# Patient Record
Sex: Female | Born: 1990 | Hispanic: Yes | Marital: Married
Health system: Southern US, Academic
[De-identification: ages and names within clinical notes are randomized; demographics above are authoritative.]

## PROBLEM LIST (undated history)

## (undated) ENCOUNTER — Encounter

## (undated) ENCOUNTER — Ambulatory Visit

## (undated) ENCOUNTER — Telehealth

## (undated) ENCOUNTER — Ambulatory Visit: Attending: Registered" | Primary: Registered"

## (undated) ENCOUNTER — Encounter: Attending: Specialist | Primary: Specialist

## (undated) ENCOUNTER — Encounter: Attending: Obstetrics & Gynecology | Primary: Obstetrics & Gynecology

## (undated) ENCOUNTER — Encounter: Attending: Medical | Primary: Medical

## (undated) ENCOUNTER — Ambulatory Visit: Attending: Specialist | Primary: Specialist

## (undated) ENCOUNTER — Telehealth: Attending: Registered" | Primary: Registered"

## (undated) ENCOUNTER — Telehealth: Attending: Specialist | Primary: Specialist

## (undated) ENCOUNTER — Encounter: Attending: Registered" | Primary: Registered"

## (undated) ENCOUNTER — Encounter
Attending: Pharmacist Clinician (PhC)/ Clinical Pharmacy Specialist | Primary: Pharmacist Clinician (PhC)/ Clinical Pharmacy Specialist

## (undated) ENCOUNTER — Encounter: Attending: Psychiatry | Primary: Psychiatry

## (undated) ENCOUNTER — Ambulatory Visit: Attending: Infectious Disease | Primary: Infectious Disease

## (undated) ENCOUNTER — Telehealth: Attending: Obstetrics & Gynecology | Primary: Obstetrics & Gynecology

## (undated) ENCOUNTER — Telehealth
Attending: Student in an Organized Health Care Education/Training Program | Primary: Student in an Organized Health Care Education/Training Program

## (undated) DIAGNOSIS — N6452 Nipple discharge: Secondary | ICD-10-CM

## (undated) DIAGNOSIS — B20 Human immunodeficiency virus [HIV] disease: Secondary | ICD-10-CM

---

## 2015-09-29 ENCOUNTER — Encounter (HOSPITAL_COMMUNITY): Payer: Self-pay | Admitting: Emergency Medicine

## 2015-09-29 ENCOUNTER — Inpatient Hospital Stay (HOSPITAL_COMMUNITY)
Admission: EM | Admit: 2015-09-29 | Discharge: 2015-10-03 | DRG: 603 | Disposition: A | Discharge status: Home / Self Care | Payer: Self-pay | Attending: General Surgery | Admitting: General Surgery

## 2015-09-29 ENCOUNTER — Emergency Department (HOSPITAL_COMMUNITY): Payer: Self-pay

## 2015-09-29 DIAGNOSIS — R739 Hyperglycemia, unspecified: Secondary | ICD-10-CM | POA: Diagnosis present

## 2015-09-29 DIAGNOSIS — E669 Obesity, unspecified: Secondary | ICD-10-CM | POA: Diagnosis present

## 2015-09-29 DIAGNOSIS — L02211 Cutaneous abscess of abdominal wall: Principal | ICD-10-CM | POA: Diagnosis present

## 2015-09-29 DIAGNOSIS — L0291 Cutaneous abscess, unspecified: Secondary | ICD-10-CM

## 2015-09-29 DIAGNOSIS — L0390 Cellulitis, unspecified: Secondary | ICD-10-CM

## 2015-09-29 LAB — BASIC METABOLIC PANEL
Anion gap: 9 (ref 5–15)
BUN: 7 mg/dL (ref 6–20)
CALCIUM: 8.6 mg/dL — AB (ref 8.9–10.3)
CO2: 30 mmol/L (ref 22–32)
CREATININE: 0.79 mg/dL (ref 0.44–1.00)
Chloride: 98 mmol/L — ABNORMAL LOW (ref 101–111)
GFR calc Af Amer: >60 mL/min (ref >60)
GLUCOSE: 144 mg/dL — AB (ref 65–99)
POTASSIUM: 3.4 mmol/L — AB (ref 3.5–5.1)
Sodium: 137 mmol/L (ref 135–145)

## 2015-09-29 LAB — CBC WITH DIFFERENTIAL/PLATELET
Basophils Absolute: 0 10*3/uL (ref 0.0–0.1)
Basophils Relative: 0 %
EOS PCT: 1 %
Eosinophils Absolute: 0.1 10*3/uL (ref 0.0–0.7)
HCT: 36.8 % (ref 36.0–46.0)
Hemoglobin: 11.8 g/dL — ABNORMAL LOW (ref 12.0–15.0)
LYMPHS ABS: 2.5 10*3/uL (ref 0.7–4.0)
LYMPHS PCT: 32 %
MCH: 25.1 pg — AB (ref 26.0–34.0)
MCHC: 32.1 g/dL (ref 30.0–36.0)
MCV: 78.3 fL (ref 78.0–100.0)
MONO ABS: 0.5 10*3/uL (ref 0.1–1.0)
MONOS PCT: 7 %
Neutro Abs: 4.7 10*3/uL (ref 1.7–7.7)
Neutrophils Relative %: 60 %
PLATELETS: 282 10*3/uL (ref 150–400)
RBC: 4.7 MIL/uL (ref 3.87–5.11)
RDW: 13.6 % (ref 11.5–15.5)
WBC: 7.8 10*3/uL (ref 4.0–10.5)

## 2015-09-29 MED ORDER — SODIUM CHLORIDE 0.9 % IV BOLUS (SEPSIS)
1000.0000 mL | Freq: Once | INTRAVENOUS | Status: AC
  Administered 2015-09-29: 1000 mL via INTRAVENOUS

## 2015-09-29 NOTE — ED Provider Notes (Signed)
CSN: [id number]     Arrival date & time 09/29/15  2041 History   First MD Initiated Contact with Patient 09/29/15 2238     Chief Complaint  Patient presents with  . Abscess  . Cellulitis   Jamie Heath is a 24 y.o. female with a history of a previous cholecystectomy many years ago presents to the emergency department complaining of a draining abscess to her right lateral abdomen and back. She reports she first noticed this area approximately 1 week ago and has been getting progressively worse. She reports initially one week ago she had flulike symptoms that have since resolved. She reports currently she is having yellowish and whitish discharge from it. She complains of 5 out of 10 pain there. She put she had some fevers last week but no fevers recently. She denies history of similar abscesses. She denies recent surgeries. She denies fevers, chills, abdominal pain, nausea, vomiting, diarrhea, coughing, shortness of breath, chest pain, urinary symptoms, back pain, numbness, tingling, weakness, or new rashes. She denies trauma or history of shingles previously.  (Consider location/radiation/quality/duration/timing/severity/associated sxs/prior Treatment) Patient is a 24 y.o. female presenting with abscess. The history is provided by the patient. A language interpreter was used.  Abscess Associated symptoms: no fever, no headaches, no nausea and no vomiting     History reviewed. No pertinent past medical history. History reviewed. No pertinent past surgical history. No family history on file. Social History  Substance Use Topics  . Smoking status: Never Smoker   . Smokeless tobacco: None  . Alcohol Use: No   OB History    No data available     Review of Systems  Constitutional: Negative for fever and chills.  HENT: Negative for sore throat.   Eyes: Negative for visual disturbance.  Respiratory: Negative for cough and shortness of breath.   Cardiovascular: Negative for chest  pain.  Gastrointestinal: Negative for nausea, vomiting, abdominal pain and diarrhea.  Genitourinary: Negative for dysuria, hematuria and difficulty urinating.  Musculoskeletal: Negative for back pain and neck pain.  Skin: Positive for color change and rash.  Neurological: Negative for weakness, numbness and headaches.      Allergies  Review of patient's allergies indicates no known allergies.  Home Medications   Prior to Admission medications   Not on File   BP 106/51 mmHg  Pulse 66  Temp(Src) 97.6 F (36.4 C) (Oral)  Resp 20  Ht  (1.549 m)  Wt 225 lb (102.059 kg)  BMI 42.54 kg/m2  SpO2 98%  LMP 09/03/2015 (Approximate) Physical Exam  Constitutional: She appears well-developed and well-nourished. No distress.  Nontoxic appearing obese female.  HENT:  Head: Normocephalic and atraumatic.  Mouth/Throat: Oropharynx is clear and moist.  Eyes: Conjunctivae are normal. Pupils are equal, round, and reactive to light. Right eye exhibits no discharge. Left eye exhibits no discharge.  Neck: Neck supple.  Cardiovascular: Normal rate, regular rhythm, normal heart sounds and intact distal pulses.  Exam reveals no gallop and no friction rub.   No murmur heard. Pulmonary/Chest: Effort normal and breath sounds normal. No respiratory distress. She has no wheezes. She has no rales.  Abdominal: Soft. Bowel sounds are normal. She exhibits no distension. There is no rebound and no guarding.  There is a linear-like area of abscess, erythema, and induration that radiates from her right lateral chest wall into her right back. There is discharge noted. There is tender to palpation. She has no abdominal tenderness to palpation. The area is about  24 cm in length.   Musculoskeletal: She exhibits no edema.  Lymphadenopathy:    She has no cervical adenopathy.  Neurological: She is alert. Coordination normal.  Skin: Skin is warm and dry. No rash noted. She is not diaphoretic. No erythema. No  pallor.  Psychiatric: She has a normal mood and affect. Her behavior is normal.  Nursing note and vitals reviewed.   ED Course  Procedures (including critical care time) Labs Review Labs Reviewed  CBC WITH DIFFERENTIAL/PLATELET - Abnormal; Notable for the following:    Hemoglobin 11.8 (*)    MCH 25.1 (*)    All other components within normal limits  BASIC METABOLIC PANEL - Abnormal; Notable for the following:    Potassium 3.4 (*)    Chloride 98 (*)    Glucose, Bld 144 (*)    Calcium 8.6 (*)    All other components within normal limits  CULTURE, BLOOD (ROUTINE X 2)  CULTURE, BLOOD (ROUTINE X 2)  I-STAT BETA HCG BLOOD, ED ([facility], [facility], [facility] ONLY)    Imaging Review Ct Abdomen W Contrast  09/30/2015  CLINICAL DATA:  Abscess on abdominal wall, draining. Reportedly cellulitic change in a perfect line. EXAM: CT ABDOMEN WITH CONTRAST TECHNIQUE: Multidetector CT imaging of the abdomen was performed using the standard protocol following bolus administration of intravenous contrast. CONTRAST:  100mL OMNIPAQUE IOHEXOL 300 MG/ML  SOLN COMPARISON:  None. FINDINGS: Lower chest and abdominal wall: There is a fairly discrete band of subcutaneous fluid and fat reticulation in the right flank, respecting midline. Although there are areas of discrete fluid there is no rim enhancing fluid collection. Changes extend to the mildly thick and lower right latissimus dorsi and right abdominal wall. No deep source is identified; no primary spinal or intraperitoneal infection. Reportedly no history of trauma. Hepatobiliary: No focal liver abnormality.No evidence of biliary obstruction or stone. Pancreas: Unremarkable. Spleen: Unremarkable. Adrenals/Urinary Tract: Negative adrenals. No hydronephrosis or stone. Stomach/Bowel:  No obstruction. Vascular/Lymphatic: No acute vascular abnormality. No mass or adenopathy. Peritoneal: No ascites or pneumoperitoneum. Musculoskeletal: Negative IMPRESSION: Band of subcutaneous fluid and  inflammation in the right flank correlating with history. Unusual discrete linear morphology for cellulitis, given no trauma history question severe shingles. No drainable fluid collection. Electronically Signed   By: Marnee SpringJonathon  Watts M.D.   On: 09/30/2015 01:05   I have personally reviewed and evaluated these images and lab results as part of my medical decision-making.   EKG Interpretation None      Filed Vitals:   09/29/15 2051 09/29/15 2221 09/29/15 2356 09/30/15 0048  BP: 120/81 113/56 119/69 106/51  Pulse: 85 74 76 66  Temp: 98.1 F (36.7 C) 97.6 F (36.4 C)    TempSrc: Oral Oral    Resp: 16 16 18 20   Height: 5\' 1"  (1.549 m)     Weight: 225 lb (102.059 kg)     SpO2: 98% 96% 97% 98%     MDM   Meds given in ED:  Medications  sodium chloride 0.9 % bolus 1,000 mL (0 mLs Intravenous Stopped 09/30/15 0047)  iohexol (OMNIPAQUE) 300 MG/ML solution 100 mL (100 mLs Intravenous Contrast Given 09/30/15 0024)    New Prescriptions   No medications on file    Final diagnoses:  Abscess and cellulitis    This  is a 24 y.o. female with a history of a previous cholecystectomy many years ago presents to the emergency department complaining of a draining abscess to her right lateral abdomen and back. She reports  she first noticed this area approximately 1 week ago and has been getting progressively worse. She reports initially one week ago she had flulike symptoms that have since resolved. She reports currently she is having yellowish and whitish discharge from it. She complains of 5 out of 10 pain there. She put she had some fevers last week but no fevers recently. She denies having any pain prior to discovering this abscess. She denies any symptoms of shingles prior to this abscess. She denies trauma. On exam the patient is afebrile nontoxic appearing. Patient's abdomen is soft and nontender to palpation. Patient has a large linear approximate 24 centimeter in length area of cellulitis and  abscess. An area approximately 10 cm in length is draining pus. She has no midline back tenderness. Will obtain CT of her abdomen with IV contrast to evaluate further. Negative i-STAT hCG. CBC shows no leukocytosis. BMP is remarkable for mildly elevated glucose of 144. CT of her abdomen contrast indicated a band of subcutaneous fluid and inflammation in the right flank with a unusual discrete linear morphology.  I feel this patient would benefit from formal incision and drainage in the OR due to thickness in area of abscess. Will consult general surgery. I consulted with general surgeon Dr. Janee Morn who reports he will be down to evaluate the patient. At shift change patient is awaiting evaluation by general surgery. Recent care handed off to Dr. Patria Mane who will disposition the patient after evaluation by general surgery.  This patient was discussed with and evaluated by Dr. Patria Mane who agrees with assessment and plan.     Everlene Farrier, PA-C 09/30/15 0139  Azalia Bilis, MD 09/30/15 [phone]

## 2015-09-29 NOTE — ED Notes (Signed)
Pt. presents with skin abscess with redness/swelling and drainage at right lateral waist onset last week , denies fever or chills.

## 2015-09-30 ENCOUNTER — Inpatient Hospital Stay (HOSPITAL_COMMUNITY): Payer: MEDICAID | Admitting: Anesthesiology

## 2015-09-30 ENCOUNTER — Inpatient Hospital Stay (HOSPITAL_COMMUNITY): Payer: Self-pay | Admitting: Anesthesiology

## 2015-09-30 ENCOUNTER — Encounter (HOSPITAL_COMMUNITY): Admission: EM | Disposition: A | Discharge status: Home / Self Care | Payer: Self-pay | Source: Home / Self Care

## 2015-09-30 ENCOUNTER — Encounter (HOSPITAL_COMMUNITY): Payer: Self-pay | Admitting: Radiology

## 2015-09-30 DIAGNOSIS — L02211 Cutaneous abscess of abdominal wall: Secondary | ICD-10-CM | POA: Diagnosis present

## 2015-09-30 HISTORY — PX: IRRIGATION AND DEBRIDEMENT ABSCESS: SHX5252

## 2015-09-30 LAB — GLUCOSE, CAPILLARY
Glucose-Capillary: 118 mg/dL — ABNORMAL HIGH (ref 65–99)
Glucose-Capillary: 79 mg/dL (ref 65–99)
Glucose-Capillary: 79 mg/dL (ref 65–99)
Glucose-Capillary: 99 mg/dL (ref 65–99)

## 2015-09-30 LAB — SURGICAL PCR SCREEN
MRSA, PCR: NEGATIVE
Staphylococcus aureus: POSITIVE — AB

## 2015-09-30 LAB — I-STAT BETA HCG BLOOD, ED (MC, WL, AP ONLY): I-stat hCG, quantitative: <5 m[IU]/mL (ref <5)

## 2015-09-30 SURGERY — IRRIGATION AND DEBRIDEMENT ABSCESS
Anesthesia: General | Site: Flank | Laterality: Right

## 2015-09-30 MED ORDER — LIDOCAINE HCL (CARDIAC) 20 MG/ML IV SOLN
INTRAVENOUS | Status: AC
  Filled 2015-09-30: qty 5

## 2015-09-30 MED ORDER — ONDANSETRON 4 MG PO TBDP: 4.0000 mg | ORAL_TABLET | Freq: Four times a day (QID) | ORAL | Status: DC | PRN

## 2015-09-30 MED ORDER — PANTOPRAZOLE SODIUM 40 MG IV SOLR: 40.0000 mg | Freq: Every day | INTRAVENOUS | Status: DC

## 2015-09-30 MED ORDER — SUCCINYLCHOLINE CHLORIDE 20 MG/ML IJ SOLN
INTRAMUSCULAR | Status: DC | PRN
  Administered 2015-09-30: 120 mg via INTRAVENOUS

## 2015-09-30 MED ORDER — MORPHINE SULFATE (PF) 2 MG/ML IV SOLN
2.0000 mg | INTRAVENOUS | Status: DC | PRN
  Administered 2015-09-30: 4 mg via INTRAVENOUS
  Administered 2015-10-01 – 2015-10-02 (×5): 2 mg via INTRAVENOUS
  Administered 2015-10-02: 4 mg via INTRAVENOUS
  Filled 2015-09-30: qty 2
  Filled 2015-09-30 (×4): qty 1
  Filled 2015-09-30: qty 2
  Filled 2015-09-30: qty 1

## 2015-09-30 MED ORDER — INSULIN ASPART 100 UNIT/ML ~~LOC~~ SOLN
0.0000 [IU] | SUBCUTANEOUS | Status: DC
  Administered 2015-10-02: 2 [IU] via SUBCUTANEOUS

## 2015-09-30 MED ORDER — FENTANYL CITRATE (PF) 100 MCG/2ML IJ SOLN
INTRAMUSCULAR | Status: DC | PRN
  Administered 2015-09-30: 50 ug via INTRAVENOUS
  Administered 2015-09-30: 100 ug via INTRAVENOUS
  Administered 2015-09-30: 50 ug via INTRAVENOUS

## 2015-09-30 MED ORDER — PANTOPRAZOLE SODIUM 40 MG PO TBEC
40.0000 mg | DELAYED_RELEASE_TABLET | Freq: Every day | ORAL | Status: DC
  Administered 2015-09-30 – 2015-10-03 (×4): 40 mg via ORAL
  Filled 2015-09-30 (×4): qty 1

## 2015-09-30 MED ORDER — OXYCODONE HCL 5 MG PO TABS: 5.0000 mg | ORAL_TABLET | ORAL | Status: DC | PRN

## 2015-09-30 MED ORDER — LACTATED RINGERS IV SOLN
INTRAVENOUS | Status: DC
  Administered 2015-09-30: 12:00:00 via INTRAVENOUS

## 2015-09-30 MED ORDER — HYDROMORPHONE HCL 1 MG/ML IJ SOLN: 0.2500 mg | INTRAMUSCULAR | Status: DC | PRN

## 2015-09-30 MED ORDER — ROCURONIUM BROMIDE 50 MG/5ML IV SOLN
INTRAVENOUS | Status: AC
  Filled 2015-09-30: qty 1

## 2015-09-30 MED ORDER — VANCOMYCIN HCL IN DEXTROSE 1-5 GM/200ML-% IV SOLN
1000.0000 mg | Freq: Three times a day (TID) | INTRAVENOUS | Status: DC
  Administered 2015-09-30 – 2015-10-03 (×10): 1000 mg via INTRAVENOUS
  Filled 2015-09-30 (×14): qty 200

## 2015-09-30 MED ORDER — ONDANSETRON HCL 4 MG/2ML IJ SOLN
INTRAMUSCULAR | Status: DC | PRN
  Administered 2015-09-30: 4 mg via INTRAVENOUS

## 2015-09-30 MED ORDER — OXYCODONE HCL 5 MG/5ML PO SOLN: 5.0000 mg | Freq: Once | ORAL | Status: DC | PRN

## 2015-09-30 MED ORDER — PIPERACILLIN-TAZOBACTAM 3.375 G IVPB
3.3750 g | Freq: Three times a day (TID) | INTRAVENOUS | Status: DC
  Administered 2015-09-30 – 2015-10-03 (×10): 3.375 g via INTRAVENOUS
  Filled 2015-09-30 (×14): qty 50

## 2015-09-30 MED ORDER — POTASSIUM CHLORIDE IN NACL 20-0.9 MEQ/L-% IV SOLN
INTRAVENOUS | Status: DC
  Administered 2015-09-30 – 2015-10-01 (×3): via INTRAVENOUS
  Administered 2015-10-01: 1 mL via INTRAVENOUS
  Administered 2015-10-02: 10:00:00 via INTRAVENOUS
  Administered 2015-10-02 (×2): 1 mL via INTRAVENOUS
  Administered 2015-10-03: 11:00:00 via INTRAVENOUS
  Administered 2015-10-03: 1 mL via INTRAVENOUS
  Filled 2015-09-30 (×9): qty 1000

## 2015-09-30 MED ORDER — ONDANSETRON HCL 4 MG/2ML IJ SOLN
4.0000 mg | Freq: Four times a day (QID) | INTRAMUSCULAR | Status: DC | PRN
  Administered 2015-10-02: 4 mg via INTRAVENOUS
  Filled 2015-09-30: qty 2

## 2015-09-30 MED ORDER — LIDOCAINE HCL (CARDIAC) 20 MG/ML IV SOLN
INTRAVENOUS | Status: DC | PRN
  Administered 2015-09-30: 100 mg via INTRAVENOUS
  Administered 2015-09-30: 100 mg via INTRATRACHEAL

## 2015-09-30 MED ORDER — IOHEXOL 300 MG/ML  SOLN
100.0000 mL | Freq: Once | INTRAMUSCULAR | Status: AC | PRN
  Administered 2015-09-30: 100 mL via INTRAVENOUS

## 2015-09-30 MED ORDER — PROPOFOL 10 MG/ML IV BOLUS
INTRAVENOUS | Status: DC | PRN
  Administered 2015-09-30: 150 mg via INTRAVENOUS

## 2015-09-30 MED ORDER — MIDAZOLAM HCL 2 MG/2ML IJ SOLN
INTRAMUSCULAR | Status: AC
  Filled 2015-09-30: qty 4

## 2015-09-30 MED ORDER — PROMETHAZINE HCL 25 MG/ML IJ SOLN: 6.2500 mg | INTRAMUSCULAR | Status: DC | PRN

## 2015-09-30 MED ORDER — MIDAZOLAM HCL 5 MG/5ML IJ SOLN
INTRAMUSCULAR | Status: DC | PRN
  Administered 2015-09-30: 2 mg via INTRAVENOUS

## 2015-09-30 MED ORDER — FENTANYL CITRATE (PF) 250 MCG/5ML IJ SOLN
INTRAMUSCULAR | Status: AC
  Filled 2015-09-30: qty 5

## 2015-09-30 MED ORDER — OXYCODONE HCL 5 MG PO TABS: 5.0000 mg | ORAL_TABLET | Freq: Once | ORAL | Status: DC | PRN

## 2015-09-30 MED ORDER — ONDANSETRON HCL 4 MG/2ML IJ SOLN
INTRAMUSCULAR | Status: AC
  Filled 2015-09-30: qty 2

## 2015-09-30 MED ORDER — DIPHENHYDRAMINE HCL 25 MG PO CAPS: 25.0000 mg | ORAL_CAPSULE | Freq: Four times a day (QID) | ORAL | Status: DC | PRN

## 2015-09-30 MED ORDER — 0.9 % SODIUM CHLORIDE (POUR BTL) OPTIME
TOPICAL | Status: DC | PRN
  Administered 2015-09-30: 1000 mL

## 2015-09-30 MED ORDER — PROPOFOL 10 MG/ML IV BOLUS
INTRAVENOUS | Status: AC
  Filled 2015-09-30: qty 20

## 2015-09-30 MED ORDER — DIPHENHYDRAMINE HCL 50 MG/ML IJ SOLN: 25.0000 mg | Freq: Four times a day (QID) | INTRAMUSCULAR | Status: DC | PRN

## 2015-09-30 SURGICAL SUPPLY — 34 items
BLADE SURG ROTATE 9660 (MISCELLANEOUS) IMPLANT
BNDG GAUZE ELAST 4 BULKY (GAUZE/BANDAGES/DRESSINGS) ×6 IMPLANT
CANISTER SUCTION 2500CC (MISCELLANEOUS) ×3 IMPLANT
COVER SURGICAL LIGHT HANDLE (MISCELLANEOUS) ×3 IMPLANT
DRAPE LAPAROSCOPIC ABDOMINAL (DRAPES) IMPLANT
DRAPE LAPAROTOMY 100X72 PEDS (DRAPES) ×3 IMPLANT
DRAPE PED LAPAROTOMY (DRAPES) ×3 IMPLANT
DRSG PAD ABDOMINAL 8X10 ST (GAUZE/BANDAGES/DRESSINGS) ×6 IMPLANT
ELECT CAUTERY BLADE 6.4 (BLADE) ×3 IMPLANT
ELECT REM PT RETURN 9FT ADLT (ELECTROSURGICAL) ×3
ELECTRODE REM PT RTRN 9FT ADLT (ELECTROSURGICAL) ×1 IMPLANT
GAUZE SPONGE 4X4 12PLY STRL (GAUZE/BANDAGES/DRESSINGS) IMPLANT
GLOVE BIO SURGEON STRL SZ7 (GLOVE) ×3 IMPLANT
GLOVE BIOGEL PI IND STRL 7.5 (GLOVE) ×1 IMPLANT
GLOVE BIOGEL PI INDICATOR 7.5 (GLOVE) ×2
GOWN STRL REUS W/ TWL LRG LVL3 (GOWN DISPOSABLE) ×2 IMPLANT
GOWN STRL REUS W/TWL LRG LVL3 (GOWN DISPOSABLE) ×4
KIT BASIN OR (CUSTOM PROCEDURE TRAY) ×3 IMPLANT
KIT ROOM TURNOVER OR (KITS) ×3 IMPLANT
NS IRRIG 1000ML POUR BTL (IV SOLUTION) ×3 IMPLANT
PACK SURGICAL SETUP 50X90 (CUSTOM PROCEDURE TRAY) ×3 IMPLANT
PAD ABD 8X10 STRL (GAUZE/BANDAGES/DRESSINGS) ×3 IMPLANT
PAD ARMBOARD 7.5X6 YLW CONV (MISCELLANEOUS) ×6 IMPLANT
PENCIL BUTTON HOLSTER BLD 10FT (ELECTRODE) ×3 IMPLANT
SWAB COLLECTION DEVICE MRSA (MISCELLANEOUS) IMPLANT
SWAB CULTURE LIQUID MINI MALE (MISCELLANEOUS) ×3 IMPLANT
SYSTEM CHEST DRAIN TLS 7FR (DRAIN) ×3 IMPLANT
TAPE CLOTH SURG 6X10 WHT LF (GAUZE/BANDAGES/DRESSINGS) ×3 IMPLANT
TOWEL OR 17X24 6PK STRL BLUE (TOWEL DISPOSABLE) IMPLANT
TOWEL OR 17X26 10 PK STRL BLUE (TOWEL DISPOSABLE) ×3 IMPLANT
TUBE ANAEROBIC SPECIMEN COL (MISCELLANEOUS) ×3 IMPLANT
TUBE CONNECTING 12'X1/4 (SUCTIONS) ×1
TUBE CONNECTING 12X1/4 (SUCTIONS) ×2 IMPLANT
YANKAUER SUCT BULB TIP NO VENT (SUCTIONS) ×3 IMPLANT

## 2015-09-30 NOTE — Progress Notes (Addendum)
Received patient from ED with significant other. Patient AOx4, ambulatory, pain at 2/10, VS stable but noted low BP at 98/54 but does not feel dizzy, P62, O2Sat=99 RA and noted not in distress.  Patient oriented to room, bed controls and call light.

## 2015-09-30 NOTE — Transfer of Care (Signed)
Immediate Anesthesia Transfer of Care Note  Patient: Jamie Heath  Procedure(s) Performed: Procedure(s): IRRIGATION AND DEBRIDEMENT FLANK ABSCESS (Right)  Patient Location: PACU  Anesthesia Type:General  Level of Consciousness: awake, oriented and patient cooperative  Airway & Oxygen Therapy: Patient Spontanous Breathing and Patient connected to nasal cannula oxygen  Post-op Assessment: Report given to RN and Post -op Vital signs reviewed and stable  Post vital signs: Reviewed  Last Vitals:  Filed Vitals:   09/30/15 0809  BP: 104/51  Pulse: 58  Temp: 36.7 C  Resp: 19    Complications: No apparent anesthesia complications

## 2015-09-30 NOTE — Anesthesia Postprocedure Evaluation (Signed)
  Anesthesia Post-op Note  Patient: Jamie Heath  Procedure(s) Performed: Procedure(s) (LRB): IRRIGATION AND DEBRIDEMENT FLANK ABSCESS (Right)  Patient Location: PACU  Anesthesia Type: General  Level of Consciousness: awake and alert   Airway and Oxygen Therapy: Patient Spontanous Breathing  Post-op Pain: mild  Post-op Assessment: Post-op Vital signs reviewed, Patient's Cardiovascular Status Stable, Respiratory Function Stable, Patent Airway and No signs of Nausea or vomiting  Last Vitals:  Filed Vitals:   09/30/15 1310  BP:   Pulse:   Temp: 36.4 C  Resp:     Post-op Vital Signs: stable   Complications: No apparent anesthesia complications

## 2015-09-30 NOTE — H&P (Signed)
Jamie Heath Nattie Lazenby is an 24 y.o. female.   Chief Complaint: pus draining from R back HPI: Jamie Heath presented to the emergency department complaining of a one-week history of worsening pain and drainage from her right back/flank area. She describes initially a dimple in the region which has gradually worsened over the past several days. Has been draining yellow to whitish fluid is continuing to get bigger and more painful. She denies any trauma to the area. She recently had flulike symptoms as well and was taking TheraFlu over-the-counter. Those symptoms, including subjective fever and cough, have resolved. She was evaluated in the emergency department and underwent CT scan of the abdomen and pelvis. This demonstrates a band of subcutaneous fluid and inflammation in the right flank. I was asked to see her for surgical management.  History reviewed. No pertinent past medical history.  History reviewed. No pertinent past surgical history.  No family history on file. Social History:  reports that she has never smoked. She does not have any smokeless tobacco history on file. She reports that she does not drink alcohol or use illicit drugs.  Allergies: No Known Allergies   (Not in a hospital admission)  Results for orders placed or performed during the hospital encounter of 09/29/15 (from the past 48 hour(s))  CBC with Differential     Status: Abnormal   Collection Time: 09/29/15  9:04 PM  Result Value Ref Range   WBC 7.8 4.0 - 10.5 K/uL   RBC 4.70 3.87 - 5.11 MIL/uL   Hemoglobin 11.8 (L) 12.0 - 15.0 g/dL   HCT 36.8 36.0 - 46.0 %   MCV 78.3 78.0 - 100.0 fL   MCH 25.1 (L) 26.0 - 34.0 pg   MCHC 32.1 30.0 - 36.0 g/dL   RDW 13.6 11.5 - 15.5 %   Platelets 282 150 - 400 K/uL   Neutrophils Relative % 60 %   Neutro Abs 4.7 1.7 - 7.7 K/uL   Lymphocytes Relative 32 %   Lymphs Abs 2.5 0.7 - 4.0 K/uL   Monocytes Relative 7 %   Monocytes Absolute 0.5 0.1 - 1.0 K/uL   Eosinophils Relative 1 %   Eosinophils Absolute 0.1 0.0 - 0.7 K/uL   Basophils Relative 0 %   Basophils Absolute 0.0 0.0 - 0.1 K/uL  Basic metabolic panel     Status: Abnormal   Collection Time: 09/29/15  9:04 PM  Result Value Ref Range   Sodium 137 135 - 145 mmol/L   Potassium 3.4 (L) 3.5 - 5.1 mmol/L   Chloride 98 (L) 101 - 111 mmol/L   CO2 30 22 - 32 mmol/L   Glucose, Bld 144 (H) 65 - 99 mg/dL   BUN 7 6 - 20 mg/dL   Creatinine, Ser 0.79 0.44 - 1.00 mg/dL   Calcium 8.6 (L) 8.9 - 10.3 mg/dL   GFR calc non Af Amer >60 >60 mL/min   GFR calc Af Amer >60 >60 mL/min    Comment: (NOTE) The eGFR has been calculated using the CKD EPI equation. This calculation has not been validated in all clinical situations. eGFR's persistently <60 mL/min signify possible Chronic Kidney Disease.    Anion gap 9 5 - 15  I-Stat beta hCG blood, ED     Status: None   Collection Time: 09/29/15 11:52 PM  Result Value Ref Range   I-stat hCG, quantitative <5.0 <5 mIU/mL   Comment 3            Comment:   GEST. AGE  CONC.  (mIU/mL)   <=1 WEEK        5 - 50     2 WEEKS       50 - 500     3 WEEKS       100 - 10,000     4 WEEKS     1,000 - 30,000        FEMALE AND NON-PREGNANT FEMALE:     LESS THAN 5 mIU/mL    Ct Abdomen W Contrast  09/30/2015  CLINICAL DATA:  Abscess on abdominal wall, draining. Reportedly cellulitic change in a perfect line. EXAM: CT ABDOMEN WITH CONTRAST TECHNIQUE: Multidetector CT imaging of the abdomen was performed using the standard protocol following bolus administration of intravenous contrast. CONTRAST:  167m OMNIPAQUE IOHEXOL 300 MG/ML  SOLN COMPARISON:  None. FINDINGS: Lower chest and abdominal wall: There is a fairly discrete band of subcutaneous fluid and fat reticulation in the right flank, respecting midline. Although there are areas of discrete fluid there is no rim enhancing fluid collection. Changes extend to the mildly thick and lower right latissimus dorsi and right abdominal wall. No deep source  is identified; no primary spinal or intraperitoneal infection. Reportedly no history of trauma. Hepatobiliary: No focal liver abnormality.No evidence of biliary obstruction or stone. Pancreas: Unremarkable. Spleen: Unremarkable. Adrenals/Urinary Tract: Negative adrenals. No hydronephrosis or stone. Stomach/Bowel:  No obstruction. Vascular/Lymphatic: No acute vascular abnormality. No mass or adenopathy. Peritoneal: No ascites or pneumoperitoneum. Musculoskeletal: Negative IMPRESSION: Band of subcutaneous fluid and inflammation in the right flank correlating with history. Unusual discrete linear morphology for cellulitis, given no trauma history question severe shingles. No drainable fluid collection. Electronically Signed   By: JMonte FantasiaM.D.   On: 09/30/2015 01:05    Review of Systems  Constitutional: Positive for fever and chills.  HENT: Negative.   Eyes: Negative.   Respiratory: Positive for cough. Negative for wheezing.   Cardiovascular: Negative for chest pain.  Gastrointestinal: Negative for nausea, vomiting and abdominal pain.  Genitourinary: Negative.   Musculoskeletal: Positive for back pain.       See history of present illness  Skin:       See history of present illness  Neurological: Negative.   Endo/Heme/Allergies: Negative.   Psychiatric/Behavioral: Negative.     Blood pressure 109/54, pulse 73, temperature 97.6 F (36.4 C), temperature source Oral, resp. rate 20, height _0  (1.549 m), weight 102.059 kg (225 lb), last menstrual period 09/03/2015, SpO2 96 %. Physical Exam  Constitutional: She is oriented to person, place, and time. She appears well-developed and well-nourished. No distress.  HENT:  Head: Normocephalic and atraumatic.  Right Ear: External ear normal.  Left Ear: External ear normal.  Nose: Nose normal.  Mouth/Throat: Oropharynx is clear and moist.  Eyes: EOM are normal. Pupils are equal, round, and reactive to light.  Neck: No tracheal deviation  present.  Cardiovascular: Normal rate and normal heart sounds.   Respiratory: Effort normal and breath sounds normal. No stridor. No respiratory distress. She has no wheezes. She has no rales.  GI: Soft. She exhibits no distension. There is no tenderness. There is no rebound and no guarding.  Musculoskeletal:       Back:  Linear induration with purulent drainage extending along right flank towards the spine with localized cellulitis and fluctuance  Neurological: She is alert and oriented to person, place, and time.  Psychiatric: She has a normal mood and affect.     Assessment/Plan Cellulitis with abscess of the  right flank - unusual linear morphology. Will admit and begin broad-spectrum IV antibiotics. We'll plan likely incision and drainage in the operating room later today. Plan was discussed in detail with her and her husband.  Kassadee Carawan E 09/30/2015, 2:30 AM

## 2015-09-30 NOTE — Op Note (Signed)
Preoperative diagnosis: right flank abscess  Postoperative diagnosis: same   Procedure: incision and drainage of right flank abscess  Surgeon: Jamie Heath, M.D.  Asst: none  Anesthesia: gen   Indications for procedure: [name] is a 24 y.o. year old female with symptoms of pain and swelling in her right flank. She was found to have purulence on exam. After discussing the risks and benefits she was brought to the operative suite for incision and drainage.  Description of procedure: The patient was brought to the operative suite, placed supine, anesthesia was administered with ETT. The patient was then put in right lateral decubitus with offloading of all pressure points using foam. WHO checklist was applied, the patient was prepped and draped. Next a 2cm incision was made at the site of highest fluctuance, immediately green pus was expressed, this was collected in a syringe and sent for culture. Next a Tresa EndoKelly was used to identify the most anterior and superior aspects of the wound, counter incisions were made at each of these sites and 2 different 1/4" penrose drains were used to tunnel through the wound from the central to lateral. The drains were tied in place with 2-0 nylon. The tract was then copiously irrigated with normal saline until clear. 4" kerlex was packed into the central wound and a large ABD was put over the entire wound. The patient was then extubated and brought to PACU in stable condition.  Findings: abscess  Specimen: abscess culture  Implant: 1/4" penrose drain x2  Blood loss: 100cc  Local anesthesia: noen  Complications: none  Jamie Heath, M.D. General, Bariatric, & Minimally Invasive Surgery [facility]

## 2015-09-30 NOTE — Anesthesia Procedure Notes (Signed)
Procedure Name: Intubation Date/Time: 09/30/2015 12:08 PM Performed by: Jenne Campus Pre-anesthesia Checklist: Patient identified, Emergency Drugs available, Suction available, Patient being monitored and Timeout performed Patient Re-evaluated:Patient Re-evaluated prior to inductionOxygen Delivery Method: Circle system utilized Preoxygenation: Pre-oxygenation with 100% oxygen Intubation Type: IV induction Ventilation: Mask ventilation without difficulty Laryngoscope Size: Miller and 2 Grade View: Grade I Tube type: Oral Tube size: 7.0 mm Number of attempts: 1 Airway Equipment and Method: Stylet and LTA kit utilized Placement Confirmation: ETT inserted through vocal cords under direct vision,  positive ETCO2,  CO2 detector and breath sounds checked- equal and bilateral Secured at: 20 cm Tube secured with: Tape Dental Injury: Teeth and Oropharynx as per pre-operative assessment

## 2015-09-30 NOTE — ED Notes (Signed)
Dr thompson at bedside  

## 2015-09-30 NOTE — Anesthesia Preprocedure Evaluation (Signed)
Anesthesia Evaluation  Patient identified by MRN, date of birth, ID band Patient awake    Reviewed: Allergy & Precautions, H&P , NPO status , Patient's Chart, lab work & pertinent test results  History of Anesthesia Complications Negative for: history of anesthetic complications  Airway Mallampati: II  TM Distance: >3 FB Neck ROM: full    Dental no notable dental hx.    Pulmonary neg pulmonary ROS,    Pulmonary exam normal breath sounds clear to auscultation       Cardiovascular negative cardio ROS Normal cardiovascular exam Rhythm:regular Rate:Normal     Neuro/Psych negative neurological ROS     GI/Hepatic negative GI ROS, Neg liver ROS,   Endo/Other  Morbid obesity  Renal/GU negative Renal ROS     Musculoskeletal   Abdominal (+) + obese,   Peds  Hematology negative hematology ROS (+)   Anesthesia Other Findings Right back superficial abscess  Reproductive/Obstetrics negative OB ROS                             Anesthesia Physical Anesthesia Plan  ASA: III  Anesthesia Plan: General   Post-op Pain Management:    Induction: Intravenous  Airway Management Planned: Oral ETT  Additional Equipment:   Intra-op Plan:   Post-operative Plan: Extubation in OR  Informed Consent: I have reviewed the patients History and Physical, chart, labs and discussed the procedure including the risks, benefits and alternatives for the proposed anesthesia with the patient or authorized representative who has indicated his/her understanding and acceptance.   Dental Advisory Given  Plan Discussed with: Anesthesiologist, CRNA and Surgeon  Anesthesia Plan Comments:         Anesthesia Quick Evaluation

## 2015-09-30 NOTE — Progress Notes (Signed)
ANTIBIOTIC CONSULT NOTE - INITIAL  Pharmacy Consult for Vancomycin/Zosyn  Indication: Cellulitis/Abscess  No Known Allergies  Patient Measurements: Height: 5\' 1"  (154.9 cm) Weight: 225 lb (102.059 kg) IBW/kg (Calculated) : 47.8  Vital Signs: Temp: 97.6 F (36.4 C) (11/10 2221) Temp Source: Oral (11/10 2221) BP: 103/54 mmHg (11/11 0254) Pulse Rate: 68 (11/11 0254)  Labs:  Recent Labs  09/29/15 2104  WBC 7.8  HGB 11.8*  PLT 282  CREATININE 0.79   Estimated Creatinine Clearance: 116.9 mL/min (by C-G formula based on Cr of 0.79).   Assessment: 24 y/o F with with cellulitis/abscess on her right back/flank area, likely for I/D later today, WBC WNL, renal function good, starting broad spectrum anti-biotics.   Goal of Therapy:  Vancomycin trough level 10-15 mcg/ml  Plan:  -Vancomycin 1000 mg IV q8h -Zosyn 3.375G IV q8h to be infused over 4 hours -Trend WBC, temp, renal function  -Drug levels as indicated   Jamie Heath, Jamie Heath 09/30/2015,3:40 AM

## 2015-09-30 NOTE — Progress Notes (Signed)
Utilization review completed.  

## 2015-10-01 LAB — GLUCOSE, CAPILLARY
GLUCOSE-CAPILLARY: 100 mg/dL — AB (ref 65–99)
GLUCOSE-CAPILLARY: 78 mg/dL (ref 65–99)
GLUCOSE-CAPILLARY: 85 mg/dL (ref 65–99)
Glucose-Capillary: 82 mg/dL (ref 65–99)
Glucose-Capillary: 104 mg/dL — ABNORMAL HIGH (ref 65–99)
Glucose-Capillary: 73 mg/dL (ref 65–99)
Glucose-Capillary: 84 mg/dL (ref 65–99)

## 2015-10-01 MED ORDER — ENOXAPARIN SODIUM 40 MG/0.4ML ~~LOC~~ SOLN
40.0000 mg | SUBCUTANEOUS | Status: DC
  Administered 2015-10-01 – 2015-10-03 (×3): 40 mg via SUBCUTANEOUS
  Filled 2015-10-01 (×2): qty 0.4

## 2015-10-01 NOTE — Progress Notes (Signed)
General Surgery Note  LOS: 1 day  POD -  1 Day Post-Op  Assessment/Plan: 1.  IRRIGATION AND DEBRIDEMENT FLANK ABSCESS - 09/30/2015 - [facility]  On Zosyn/Vanc  Will start dressing changes - TID  Limited English skills - is married  2.  Obese 3.  Had elevated glucose on admission - will check Hgb A1C  4.  DVT prophylaxis - Start Lovenox   Active Problems:   Abscess of flank  Subjective:  Doing okay.  Needs to ambulate more. Objective:   Filed Vitals:   10/01/15 0402  BP: 92/47  Pulse: 70  Temp: 98.6 F (37 C)  Resp: 18     Intake/Output from previous day:  11/11 0701 - 11/12 0700 In: 4535.8 [P.O.:1160; I.V.:1710; IV Piggyback:1665.8] Out: 1200 [Urine:1200]  Intake/Output this shift:      Physical Exam:   General: [name] who is alert and oriented.    HEENT: Normal. Pupils equal. .   Lungs: Clear   Wound: right flank wound with 2 "circle" penrose drains   Lab Results:    Recent Labs  09/29/15 2104  WBC 7.8  HGB 11.8*  HCT 36.8  PLT 282    BMET   Recent Labs  09/29/15 2104  NA 137  K 3.4*  CL 98*  CO2 30  GLUCOSE 144*  BUN 7  CREATININE 0.79  CALCIUM 8.6*    PT/INR  No results for input(s): LABPROT, INR in the last 72 hours.  ABG  No results for input(s): PHART, HCO3 in the last 72 hours.  Invalid input(s): PCO2, PO2   Studies/Results:  Ct Abdomen W Contrast  09/30/2015  CLINICAL DATA:  Abscess on abdominal wall, draining. Reportedly cellulitic change in a perfect line. EXAM: CT ABDOMEN WITH CONTRAST TECHNIQUE: Multidetector CT imaging of the abdomen was performed using the standard protocol following bolus administration of intravenous contrast. CONTRAST:  100mL OMNIPAQUE IOHEXOL 300 MG/ML  SOLN COMPARISON:  None. FINDINGS: Lower chest and abdominal wall: There is a fairly discrete band of subcutaneous fluid and fat reticulation in the right flank, respecting midline. Although there are areas of discrete fluid there is no rim  enhancing fluid collection. Changes extend to the mildly thick and lower right latissimus dorsi and right abdominal wall. No deep source is identified; no primary spinal or intraperitoneal infection. Reportedly no history of trauma. Hepatobiliary: No focal liver abnormality.No evidence of biliary obstruction or stone. Pancreas: Unremarkable. Spleen: Unremarkable. Adrenals/Urinary Tract: Negative adrenals. No hydronephrosis or stone. Stomach/Bowel:  No obstruction. Vascular/Lymphatic: No acute vascular abnormality. No mass or adenopathy. Peritoneal: No ascites or pneumoperitoneum. Musculoskeletal: Negative IMPRESSION: Band of subcutaneous fluid and inflammation in the right flank correlating with history. Unusual discrete linear morphology for cellulitis, given no trauma history question severe shingles. No drainable fluid collection. Electronically Signed   By: Marnee SpringJonathon  Watts M.D.   On: 09/30/2015 01:05     Anti-infectives:   Anti-infectives    Start     Dose/Rate Route Frequency Ordered Stop   09/30/15 0400  vancomycin (VANCOCIN) IVPB 1000 mg/200 mL premix     1,000 mg 200 mL/hr over 60 Minutes Intravenous Every 8 hours 09/30/15 0339     09/30/15 0345  piperacillin-tazobactam (ZOSYN) IVPB 3.375 g     3.375 g 12.5 mL/hr over 240 Minutes Intravenous 3 times per day 09/30/15 0328        Ovidio Kinavid Judson Tsan, MD, FACS Pager: [phone] [facility] Office: [phone] 10/01/2015

## 2015-10-01 NOTE — Progress Notes (Signed)
Pt has been up and about mobilizing on unit with no difficulty

## 2015-10-01 NOTE — Progress Notes (Signed)
Dressing to incision changed twice on this shift. Dressing changes scheduled for three times a day. Pt and her husband educated on performing dressing changes this evening

## 2015-10-02 LAB — GLUCOSE, CAPILLARY
GLUCOSE-CAPILLARY: 85 mg/dL (ref 65–99)
Glucose-Capillary: 102 mg/dL — ABNORMAL HIGH (ref 65–99)
Glucose-Capillary: 133 mg/dL — ABNORMAL HIGH (ref 65–99)
Glucose-Capillary: 89 mg/dL (ref 65–99)
Glucose-Capillary: 91 mg/dL (ref 65–99)

## 2015-10-02 NOTE — Progress Notes (Signed)
General Surgery Note  LOS: 2 days  POD -  2 Days Post-Op  Assessment/Plan: 1.  IRRIGATION AND DEBRIDEMENT FLANK ABSCESS - 09/30/2015 - [facility]  Cultures - staph aureus  On Zosyn/Vanc  Dressing changes - TID The wound has two penrose drains The wound is "soupy".  Will continue dressing changes today - then decide in AM if patient should go back to the OR for wider debridement. I discussed this with the husband and patient by translator phone.  2.  Obese 3.  Had elevated glucose on admission -   Will check Hgb A1C - according to Epic, it is "in process"  4.  DVT prophylaxis - Start Lovenox   Active Problems:   Abscess of flank  Subjective:  Doing okay.  Still has pain in flank.  Husband at bedside. Objective:   Filed Vitals:   10/02/15 0517  BP: 101/53  Pulse: 66  Temp: 98.5 F (36.9 C)  Resp: 16     Intake/Output from previous day:  11/12 0701 - 11/13 0700 In: 4096.7 [P.O.:480; I.V.:3066.7; IV Piggyback:550] Out: -   Intake/Output this shift:      Physical Exam:   General: [name] who is alert and oriented.    HEENT: Normal. Pupils equal. .   Lungs: Clear   Wound: right flank wound with 2 "circle" penrose drains.  The wound is "soupy"   Lab Results:     Recent Labs  09/29/15 2104  WBC 7.8  HGB 11.8*  HCT 36.8  PLT 282    BMET    Recent Labs  09/29/15 2104  NA 137  K 3.4*  CL 98*  CO2 30  GLUCOSE 144*  BUN 7  CREATININE 0.79  CALCIUM 8.6*    PT/INR  No results for input(s): LABPROT, INR in the last 72 hours.  ABG  No results for input(s): PHART, HCO3 in the last 72 hours.  Invalid input(s): PCO2, PO2   Studies/Results:  No results found.   Anti-infectives:   Anti-infectives    Start     Dose/Rate Route Frequency Ordered Stop   09/30/15 0400  vancomycin (VANCOCIN) IVPB 1000 mg/200 mL premix     1,000 mg 200 mL/hr over 60 Minutes Intravenous Every 8 hours 09/30/15 0339     09/30/15 0345  piperacillin-tazobactam  (ZOSYN) IVPB 3.375 g     3.375 g 12.5 mL/hr over 240 Minutes Intravenous 3 times per day 09/30/15 0328        Jamie Heath Jamie Havel, MD, FACS Pager: [phone] [facility] Office: [phone] 10/02/2015

## 2015-10-02 NOTE — Progress Notes (Signed)
Contacted the Spanish interpreter service and [facility] [phone]. Surgeon was rounding and needed to explain that pt will need to return back to OR for more surgery to her right flank as the site looks infected. Pt and her husband both understood the explanation from MD and did not express any further questions. Pt will be NPO from midnight

## 2015-10-03 ENCOUNTER — Encounter (HOSPITAL_COMMUNITY): Payer: Self-pay | Admitting: General Surgery

## 2015-10-03 LAB — GLUCOSE, CAPILLARY
GLUCOSE-CAPILLARY: 79 mg/dL (ref 65–99)
Glucose-Capillary: 107 mg/dL — ABNORMAL HIGH (ref 65–99)
Glucose-Capillary: 78 mg/dL (ref 65–99)
Glucose-Capillary: 87 mg/dL (ref 65–99)

## 2015-10-03 LAB — CBC WITH DIFFERENTIAL/PLATELET
BASOS ABS: 0 10*3/uL (ref 0.0–0.1)
BASOS PCT: 0 %
EOS ABS: 0.1 10*3/uL (ref 0.0–0.7)
Eosinophils Relative: 2 %
HCT: 34.8 % — ABNORMAL LOW (ref 36.0–46.0)
HEMOGLOBIN: 10.6 g/dL — AB (ref 12.0–15.0)
Lymphocytes Relative: 26 %
Lymphs Abs: 1.7 10*3/uL (ref 0.7–4.0)
MCH: 24.9 pg — ABNORMAL LOW (ref 26.0–34.0)
MCHC: 30.5 g/dL (ref 30.0–36.0)
MCV: 81.7 fL (ref 78.0–100.0)
Monocytes Absolute: 0.5 10*3/uL (ref 0.1–1.0)
Monocytes Relative: 8 %
NEUTROS PCT: 64 %
Neutro Abs: 4.2 10*3/uL (ref 1.7–7.7)
Platelets: 270 10*3/uL (ref 150–400)
RBC: 4.26 MIL/uL (ref 3.87–5.11)
RDW: 14.2 % (ref 11.5–15.5)
WBC: 6.6 10*3/uL (ref 4.0–10.5)

## 2015-10-03 LAB — CULTURE, ROUTINE-ABSCESS

## 2015-10-03 LAB — BASIC METABOLIC PANEL WITH GFR
Anion gap: 6 (ref 5–15)
BUN: <5 mg/dL — ABNORMAL LOW (ref 6–20)
CO2: 27 mmol/L (ref 22–32)
Calcium: 8.2 mg/dL — ABNORMAL LOW (ref 8.9–10.3)
Chloride: 107 mmol/L (ref 101–111)
Creatinine, Ser: 1.07 mg/dL — ABNORMAL HIGH (ref 0.44–1.00)
GFR calc Af Amer: >60 mL/min
GFR calc non Af Amer: >60 mL/min
Glucose, Bld: 99 mg/dL (ref 65–99)
Potassium: 4 mmol/L (ref 3.5–5.1)
Sodium: 140 mmol/L (ref 135–145)

## 2015-10-03 LAB — HEMOGLOBIN A1C
HEMOGLOBIN A1C: 5.8 % — AB (ref 4.8–5.6)
Mean Plasma Glucose: 120 mg/dL

## 2015-10-03 MED ORDER — OXYCODONE HCL 5 MG PO TABS: 5.0000 mg | ORAL_TABLET | ORAL | Status: DC | PRN

## 2015-10-03 MED ORDER — SULFAMETHOXAZOLE-TRIMETHOPRIM 800-160 MG PO TABS
1.0000 | ORAL_TABLET | Freq: Two times a day (BID) | ORAL | Status: DC
Start: 2015-10-03 — End: 2017-12-12

## 2015-10-03 MED ORDER — SULFAMETHOXAZOLE-TRIMETHOPRIM 400-80 MG PO TABS: 1.0000 | ORAL_TABLET | Freq: Two times a day (BID) | ORAL | Status: DC

## 2015-10-03 NOTE — Progress Notes (Signed)
Patient ID: Jamie Heath, female   DOB: 21-Oct-1991, 24 y.o.   MRN: [id number]     [place]      [address] [place], [place] [address]    Phone: [phone] FAX: [phone]     Subjective: Little pain.  VSS.  Afebrile.  Objective:  Vital signs:  Filed Vitals:   10/02/15 1353 10/02/15 2046 10/02/15 2135 10/03/15 0507  BP: 108/62 89/47 114/76 128/55  Pulse: 64 94  60  Temp: 98.1 F (36.7 C) 97.5 F (36.4 C)  98.2 F (36.8 C)  TempSrc: Oral Oral    Resp: '16 17  17  ' Height:      Weight:      SpO2: 99% 100%  98%    Last BM Date: 10/01/15  Intake/Output   Yesterday:  11/13 0701 - 11/14 0700 In: 4288.3 [P.O.:460; I.V.:2878.3; IV Piggyback:950] Out: 900 [Urine:900] This shift:    I/O last 3 completed shifts: In: 8385 [P.O.:940; I.V.:5945; IV Piggyback:1500] Out: 900 [Urine:900]       Physical Exam: General: Pt awake/alert/oriented x4 in no acute distress Skin: right flank--penrose in place, well drained no surrounding cellulitis, induration or areas of fluctuance.    Problem List:   Active Problems:   Abscess of flank    Results:   Labs: Results for orders placed or performed during the hospital encounter of 09/29/15 (from the past 48 hour(s))  Glucose, capillary     Status: Abnormal   Collection Time: 10/01/15  1:02 PM  Result Value Ref Range   Glucose-Capillary 104 (H) 65 - 99 mg/dL  Glucose, capillary     Status: None   Collection Time: 10/01/15  4:52 PM  Result Value Ref Range   Glucose-Capillary 73 65 - 99 mg/dL  Glucose, capillary     Status: None   Collection Time: 10/01/15  8:08 PM  Result Value Ref Range   Glucose-Capillary 84 65 - 99 mg/dL  Glucose, capillary     Status: None   Collection Time: 10/01/15 11:58 PM  Result Value Ref Range   Glucose-Capillary 85 65 - 99 mg/dL  Glucose, capillary     Status: Abnormal   Collection Time: 10/02/15  5:19 AM  Result Value Ref Range   Glucose-Capillary 102 (H) 65 - 99 mg/dL  Glucose, capillary     Status: Abnormal   Collection Time: 10/02/15  7:41 AM  Result Value Ref Range   Glucose-Capillary 133 (H) 65 - 99 mg/dL  Glucose, capillary     Status: None   Collection Time: 10/02/15 11:41 AM  Result Value Ref Range   Glucose-Capillary 89 65 - 99 mg/dL  Glucose, capillary     Status: None   Collection Time: 10/02/15  4:22 PM  Result Value Ref Range   Glucose-Capillary 85 65 - 99 mg/dL  Glucose, capillary     Status: None   Collection Time: 10/02/15  7:48 PM  Result Value Ref Range   Glucose-Capillary 91 65 - 99 mg/dL   Comment 1 Notify RN   Glucose, capillary     Status: None   Collection Time: 10/03/15 12:35 AM  Result Value Ref Range   Glucose-Capillary 79 65 - 99 mg/dL   Comment 1 Notify RN    Comment 2 Document in Chart   CBC with Differential/Platelet     Status: Abnormal   Collection Time: 10/03/15  2:32 AM  Result Value Ref Range   WBC 6.6 4.0 - 10.5  K/uL   RBC 4.26 3.87 - 5.11 MIL/uL   Hemoglobin 10.6 (L) 12.0 - 15.0 g/dL   HCT 34.8 (L) 36.0 - 46.0 %   MCV 81.7 78.0 - 100.0 fL   MCH 24.9 (L) 26.0 - 34.0 pg   MCHC 30.5 30.0 - 36.0 g/dL   RDW 14.2 11.5 - 15.5 %   Platelets 270 150 - 400 K/uL   Neutrophils Relative % 64 %   Neutro Abs 4.2 1.7 - 7.7 K/uL   Lymphocytes Relative 26 %   Lymphs Abs 1.7 0.7 - 4.0 K/uL   Monocytes Relative 8 %   Monocytes Absolute 0.5 0.1 - 1.0 K/uL   Eosinophils Relative 2 %   Eosinophils Absolute 0.1 0.0 - 0.7 K/uL   Basophils Relative 0 %   Basophils Absolute 0.0 0.0 - 0.1 K/uL  Basic metabolic panel     Status: Abnormal   Collection Time: 10/03/15  2:32 AM  Result Value Ref Range   Sodium 140 135 - 145 mmol/L   Potassium 4.0 3.5 - 5.1 mmol/L   Chloride 107 101 - 111 mmol/L   CO2 27 22 - 32 mmol/L   Glucose, Bld 99 65 - 99 mg/dL   BUN <5 (L) 6 - 20 mg/dL   Creatinine, Ser 1.07 (H) 0.44 - 1.00 mg/dL   Calcium 8.2 (L) 8.9 - 10.3 mg/dL   GFR calc non Af Amer >60  >60 mL/min   GFR calc Af Amer >60 >60 mL/min    Comment: (NOTE) The eGFR has been calculated using the CKD EPI equation. This calculation has not been validated in all clinical situations. eGFR's persistently <60 mL/min signify possible Chronic Kidney Disease.    Anion gap 6 5 - 15  Glucose, capillary     Status: Abnormal   Collection Time: 10/03/15  4:11 AM  Result Value Ref Range   Glucose-Capillary 107 (H) 65 - 99 mg/dL   Comment 1 Notify RN    Comment 2 Document in Chart     Imaging / Studies: No results found.  Medications / Allergies:  Scheduled Meds: . enoxaparin (LOVENOX) injection  40 mg Subcutaneous Q24H  . insulin aspart  0-15 Units Subcutaneous 6 times per day  . pantoprazole  40 mg Oral Daily  . piperacillin-tazobactam (ZOSYN)  IV  3.375 g Intravenous 3 times per day  . vancomycin  1,000 mg Intravenous Q8H   Continuous Infusions: . 0.9 % NaCl with KCl 20 mEq / L 1 mL (10/03/15 0617)   PRN Meds:.diphenhydrAMINE **OR** diphenhydrAMINE, morphine injection, ondansetron **OR** ondansetron (ZOFRAN) IV, oxyCODONE  Antibiotics: Anti-infectives    Start     Dose/Rate Route Frequency Ordered Stop   09/30/15 0400  vancomycin (VANCOCIN) IVPB 1000 mg/200 mL premix     1,000 mg 200 mL/hr over 60 Minutes Intravenous Every 8 hours 09/30/15 0339     09/30/15 0345  piperacillin-tazobactam (ZOSYN) IVPB 3.375 g     3.375 g 12.5 mL/hr over 240 Minutes Intravenous 3 times per day 09/30/15 0328          Assessment/Plan POD#3 I&D flank abscess--09/30/15--Dr. Kieth Brightly  -leave penrose in place, wound is well drained.  No need for further debridement at this time. -will await sensitivities--staph aureus -BID dressing changes -shower Hyperglycemia-await A1c VTE prophylaxis-SCD/lovenox Dispo-possibly today or tomorrow  Communicated via pacific interpreter(Sophia)  Erby Pian, ANP-BC [facility] Pager [phone]) For consults and floor pages  call [phone](7A-4:30P)  10/03/2015

## 2015-10-03 NOTE — Care Management Note (Signed)
Case Management Note  Patient Details  Name: Jamie Heath MRN: [id number] Date of Birth: 07-10-1991  Subjective/Objective:                    Action/Plan:  MATCH letter and [facility] information given to patient via interpreter Jamie Heath   Expected Discharge Date:                  Expected Discharge Plan:  Home/Self Care  In-House Referral:     Discharge planning Services  CM Consult, [facility], [facility], Medication Assistance  Post Acute Care Choice:    Choice offered to:     DME Arranged:    DME Agency:     HH Arranged:    HH Agency:     Status of Service:  Completed, signed off  Medicare Important Message Given:    Date Medicare IM Given:    Medicare IM give by:    Date Additional Medicare IM Given:    Additional Medicare Important Message give by:     If discussed at Long Length of Stay Meetings, dates discussed:    Additional Comments:  Kingsley PlanWile, Eann Cleland Marie, RN 10/03/2015, 2:18 PM

## 2015-10-03 NOTE — Progress Notes (Signed)
Interpreter Wyvonnia DuskyGraciela Namihira for [facility] Care [facility].

## 2015-10-03 NOTE — Discharge Instructions (Signed)
Wound care -take a shower every day -cover with a dry dressing  -finish your antibiotics

## 2015-10-03 NOTE — Discharge Summary (Signed)
Physician Discharge Summary  Ernst SpellLetecia Reyes Garcia ZOX:[id number] DOB: 1991/05/05 DOA: 09/29/2015  PCP: No primary care provider on file.  Consultation: none  Admit date: 09/29/2015 Discharge date: 10/03/2015  Recommendations for Outpatient Follow-up:   Follow-up Information    Follow up with [facility] On 10/11/2015.   Specialty:  General Surgery   Why:  [facility], 3:00pm, arrive no later than 2:30pm for paperwork   Contact information:   [address] [place] [address] [phone]      Discharge Diagnoses:  1. Right flank abscess   Surgical Procedure: I&D flank abscess 09/30/15 Dr. Sheliah HatchKinsinger   Discharge Condition: stable Disposition: home  Diet recommendation: regular  Filed Weights   09/29/15 2051 09/30/15 0351  Weight: 102.059 kg (225 lb) 106 kg (233 lb 11 oz)     Filed Vitals:   10/03/15 0507  BP: 128/55  Pulse: 60  Temp: 98.2 F (36.8 C)  Resp: 17     Hospital Course:  Ernst SpellLetecia Reyes Garcia [facility] of one week history of worsening right flank pain and drainage.  CT of A/P showed subcutaneous fluid and inflammation in the right flank.  We were therefore consulted.  The patient was admitted and underwent the procedure listed above which she tolerated well and was transferred to the floor.  She was kept on Vanc and Zosyn.  Cultures showed staph and was transitioned to septra on the day of discharge for additional 5 days of therapy.  On POD#3 the patient was felt stable for discharge home.  She will follow up with us in 1 week for a wound check and penrose drain removal.  Warning signs that warrant immediate attention were discussed.  She knows to call with questions or concerns.    Discharge Instructions     Medication List    TAKE these medications        oxyCODONE 5 MG immediate release tablet  Commonly known as:  Oxy IR/ROXICODONE  Take 1-2 tablets (5-10 mg total) by mouth every 4 (four)  hours as needed for moderate pain.     sulfamethoxazole-trimethoprim 400-80 MG tablet  Commonly known as:  BACTRIM  Take 1 tablet by mouth 2 (two) times daily.           Follow-up Information    Follow up with [facility] On 10/11/2015.   Specialty:  General Surgery   Why:  [facility], 3:00pm, arrive no later than 2:30pm for paperwork   Contact information:   [address] [place] [address] [phone]        The results of significant diagnostics from this hospitalization (including imaging, microbiology, ancillary and laboratory) are listed below for reference.    Significant Diagnostic Studies: Ct Abdomen W Contrast  09/30/2015  CLINICAL DATA:  Abscess on abdominal wall, draining. Reportedly cellulitic change in a perfect line. EXAM: CT ABDOMEN WITH CONTRAST TECHNIQUE: Multidetector CT imaging of the abdomen was performed using the standard protocol following bolus administration of intravenous contrast. CONTRAST:  100mL OMNIPAQUE IOHEXOL 300 MG/ML  SOLN COMPARISON:  None. FINDINGS: Lower chest and abdominal wall: There is a fairly discrete band of subcutaneous fluid and fat reticulation in the right flank, respecting midline. Although there are areas of discrete fluid there is no rim enhancing fluid collection. Changes extend to the mildly thick and lower right latissimus dorsi and right abdominal wall. No deep source is identified; no primary spinal  or intraperitoneal infection. Reportedly no history of trauma. Hepatobiliary: No focal liver abnormality.No evidence of biliary obstruction or stone. Pancreas: Unremarkable. Spleen: Unremarkable. Adrenals/Urinary Tract: Negative adrenals. No hydronephrosis or stone. Stomach/Bowel:  No obstruction. Vascular/Lymphatic: No acute vascular abnormality. No mass or adenopathy. Peritoneal: No ascites or pneumoperitoneum. Musculoskeletal: Negative IMPRESSION: Band of subcutaneous fluid and inflammation in  the right flank correlating with history. Unusual discrete linear morphology for cellulitis, given no trauma history question severe shingles. No drainable fluid collection. Electronically Signed   By: Marnee Spring M.D.   On: 09/30/2015 01:05    Microbiology: Recent Results (from the past 240 hour(s))  Culture, blood (routine x 2)     Status: None (Preliminary result)   Collection Time: 09/30/15 12:13 AM  Result Value Ref Range Status   Specimen Description BLOOD RIGHT HAND  Final   Special Requests BOTTLES DRAWN AEROBIC AND ANAEROBIC 5CC  Final   Culture NO GROWTH 2 DAYS  Final   Report Status PENDING  Incomplete  Culture, blood (routine x 2)     Status: None (Preliminary result)   Collection Time: 09/30/15 12:13 AM  Result Value Ref Range Status   Specimen Description BLOOD RIGHT HAND  Final   Special Requests BOTTLES DRAWN AEROBIC AND ANAEROBIC 5CC  Final   Culture NO GROWTH 2 DAYS  Final   Report Status PENDING  Incomplete  Surgical pcr screen     Status: Abnormal   Collection Time: 09/30/15 10:40 AM  Result Value Ref Range Status   MRSA, PCR NEGATIVE NEGATIVE Final   Staphylococcus aureus POSITIVE (A) NEGATIVE Final    Comment:        The Xpert SA Assay (FDA approved for NASAL specimens in patients over 51 years of age), is one component of a comprehensive surveillance program.  Test performance has been validated by [facility] for patients greater than or equal to 25 year old. It is not intended to diagnose infection nor to guide or monitor treatment.   Culture, routine-abscess     Status: None   Collection Time: 09/30/15 12:41 PM  Result Value Ref Range Status   Specimen Description ABSCESS  Final   Special Requests RIGHT FLANK  Final   Gram Stain   Final    ABUNDANT WBC PRESENT,BOTH PMN AND MONONUCLEAR NO SQUAMOUS EPITHELIAL CELLS SEEN FEW GRAM POSITIVE COCCI IN PAIRS IN CLUSTERS Performed at [facility]    Culture   Final    ABUNDANT  STAPHYLOCOCCUS AUREUS Note: RIFAMPIN AND GENTAMICIN SHOULD NOT BE USED AS SINGLE DRUGS FOR TREATMENT OF STAPH INFECTIONS. This organism is presumed to be Clindamycin resistant based on detection of inducible Clindamycin resistance. Performed at [facility]    Report Status 10/03/2015 FINAL  Final   Organism ID, Bacteria STAPHYLOCOCCUS AUREUS  Final      Susceptibility   Staphylococcus aureus - MIC*    CLINDAMYCIN RESISTANT      ERYTHROMYCIN >=8 RESISTANT Resistant     GENTAMICIN <=0.5 SENSITIVE Sensitive     LEVOFLOXACIN 0.25 SENSITIVE Sensitive     OXACILLIN 0.5 SENSITIVE Sensitive     RIFAMPIN <=0.5 SENSITIVE Sensitive     TRIMETH/SULFA <=10 SENSITIVE Sensitive     VANCOMYCIN <=0.5 SENSITIVE Sensitive     TETRACYCLINE <=1 SENSITIVE Sensitive     MOXIFLOXACIN <=0.25 SENSITIVE Sensitive     * ABUNDANT STAPHYLOCOCCUS AUREUS     Labs: Basic Metabolic Panel:  Recent Labs Lab 09/29/15 2104 10/03/15 0232  NA 137 140  K 3.4* 4.0  CL 98* 107  CO2 30 27  GLUCOSE 144* 99  BUN 7 <5*  CREATININE 0.79 1.07*  CALCIUM 8.6* 8.2*   Liver Function Tests: No results for input(s): AST, ALT, ALKPHOS, BILITOT, PROT, ALBUMIN in the last 168 hours. No results for input(s): LIPASE, AMYLASE in the last 168 hours. No results for input(s): AMMONIA in the last 168 hours. CBC:  Recent Labs Lab 09/29/15 2104 10/03/15 0232  WBC 7.8 6.6  NEUTROABS 4.7 4.2  HGB 11.8* 10.6*  HCT 36.8 34.8*  MCV 78.3 81.7  PLT 282 270   Cardiac Enzymes: No results for input(s): CKTOTAL, CKMB, CKMBINDEX, TROPONINI in the last 168 hours. BNP: BNP (last 3 results) No results for input(s): BNP in the last 8760 hours.  ProBNP (last 3 results) No results for input(s): PROBNP in the last 8760 hours.  CBG:  Recent Labs Lab 10/02/15 1622 10/02/15 1948 10/03/15 0035 10/03/15 0411 10/03/15 0852  GLUCAP 85 91 79 107* 78    Active Problems:   Abscess of flank   Time coordinating discharge: <30  mins  Signed:  Marticia Reifschneider, ANP-BC

## 2015-10-03 NOTE — Progress Notes (Signed)
Interpreter Wyvonnia DuskyGraciela Namihira for [facility] RN discharge instructions

## 2015-10-05 LAB — CULTURE, BLOOD (ROUTINE X 2)
Culture: NO GROWTH
Culture: NO GROWTH

## 2015-10-21 ENCOUNTER — Encounter: Payer: Self-pay | Admitting: Family Medicine

## 2015-10-21 ENCOUNTER — Ambulatory Visit: Payer: Self-pay | Attending: Family Medicine | Admitting: Family Medicine

## 2015-10-21 VITALS — BP 98/67 | HR 61 | Temp 98.3°F | Resp 16 | Ht 61.5 in | Wt 224.0 lb

## 2015-10-21 DIAGNOSIS — L02211 Cutaneous abscess of abdominal wall: Secondary | ICD-10-CM | POA: Insufficient documentation

## 2015-10-21 NOTE — Progress Notes (Signed)
Pt's here for hospital f/up for abscess and cellulitis.   Patient denies pain today.

## 2015-10-21 NOTE — Progress Notes (Signed)
CC: Hospital follow-up  HPI: Jamie Heath is a 24 y.o. female here today for a follow up visit for  [facility] a month ago for flank abscess status post incision and drainage on 09/30/15.  During her hospitalization she completed a course of treatment IV vancomycin and Zosyn and was discharged on Bactrim and with a Penrose drain Since discharge she has been to see [place] for  her postop visit and her drain was removed and is doing well at this time with no complaints of fever or back pain. The patient  has No headache, No chest pain, No abdominal pain - No Nausea, No new weakness tingling or numbness, No Cough - SOB.  No Known Allergies History reviewed. No pertinent past medical history. Current Outpatient Prescriptions on File Prior to Visit  Medication Sig Dispense Refill  . oxyCODONE (OXY IR/ROXICODONE) 5 MG immediate release tablet Take 1-2 tablets (5-10 mg total) by mouth every 4 (four) hours as needed for moderate pain. (Patient not taking: Reported on 10/21/2015) 40 tablet 0  . sulfamethoxazole-trimethoprim (BACTRIM DS,SEPTRA DS) 800-160 MG tablet Take 1 tablet by mouth 2 (two) times daily. (Patient not taking: Reported on 10/21/2015) 10 tablet 0   No current facility-administered medications on file prior to visit.   History reviewed. No pertinent family history. Social History   Social History  . Marital Status: Married    Spouse Name: N/A  . Number of Children: N/A  . Years of Education: N/A   Occupational History  . Not on file.   Social History Main Topics  . Smoking status: Never Smoker   . Smokeless tobacco: Not on file  . Alcohol Use: No  . Drug Use: No  . Sexual Activity: Not on file   Other Topics Concern  . Not on file   Social History Narrative    Review of Systems: Constitutional: Negative for fever, chills, diaphoresis, activity change, appetite change and fatigue. HENT: Negative for ear pain, nosebleeds,  congestion, facial swelling, rhinorrhea, neck pain, neck stiffness and ear discharge.  Eyes: Negative for pain, discharge, redness, itching and visual disturbance. Respiratory: Negative for cough, choking, chest tightness, shortness of breath, wheezing and stridor.  Cardiovascular: Negative for chest pain, palpitations and leg swelling. Gastrointestinal: Negative for abdominal distention. Genitourinary: Negative for dysuria, urgency, frequency, hematuria, flank pain, decreased urine volume, difficulty urinating and dyspareunia.  Musculoskeletal: Negative for back pain, joint swelling, arthralgias and gait problem. Neurological: Negative for dizziness, tremors, seizures, syncope, facial asymmetry, speech difficulty, weakness, light-headedness, numbness and headaches.  Hematological: Negative for adenopathy. Does not bruise/bleed easily. Psychiatric/Behavioral: Negative for hallucinations, behavioral problems, confusion, dysphoric mood, decreased concentration and agitation.    Objective:   Filed Vitals:   10/21/15 1516  BP: 98/67  Pulse: 61  Temp: 98.3 F (36.8 C)  Resp: 16    Physical Exam: Constitutional: Patient appears well-developed and well-nourished. No distress. HENT: Normocephalic, atraumatic, External right and left ear normal. Oropharynx is clear and moist.  Eyes: Conjunctivae and EOM are normal. PERRLA, no scleral icterus. Neck: Normal ROM. Neck supple. No JVD. No tracheal deviation. No thyromegaly. CVS: RRR, S1/S2 +, no murmurs, no gallops, no carotid bruit.  Pulmonary: Effort and breath sounds normal, no stridor, rhonchi, wheezes, rales.  Abdominal: Soft. BS +,  no distension, tenderness, rebound or guarding.  Musculoskeletal: Normal range of motion. No edema and no tenderness.  Lymphadenopathy: No lymphadenopathy noted, cervical, inguinal or axillary Neuro: Alert. Normal reflexes, muscle tone coordination.  No cranial nerve deficit. Skin: Skin is warm and dry. No rash  noted. Not diaphoretic. No erythema. No pallor. Psychiatric: Normal mood and affect. Behavior, judgment, thought content normal.  Lab Results  Component Value Date   WBC 6.6 10/03/2015   HGB 10.6* 10/03/2015   HCT 34.8* 10/03/2015   MCV 81.7 10/03/2015   PLT 270 10/03/2015   Lab Results  Component Value Date   CREATININE 1.07* 10/03/2015   BUN <5* 10/03/2015   NA 140 10/03/2015   K 4.0 10/03/2015   CL 107 10/03/2015   CO2 27 10/03/2015    Lab Results  Component Value Date   HGBA1C 5.8* 10/01/2015       Assessment and plan:  Flank abscess: Resolved.  Morbid obesity: Advised on exercise regimen on cutting back on portion sizes. She knows to call the clinic in the event of any new complains of symptoms.      Jaclyn Shaggy, MD. [facility] [phone] 10/21/2015, 3:40 PM

## 2015-10-26 ENCOUNTER — Ambulatory Visit: Payer: Self-pay | Attending: Family Medicine

## 2015-11-02 ENCOUNTER — Ambulatory Visit: Payer: Self-pay

## 2017-06-04 ENCOUNTER — Ambulatory Visit
Admission: RE | Admit: 2017-06-04 | Discharge: 2017-06-04 | Disposition: A | Discharge status: Home / Self Care | Attending: Specialist | Admitting: Specialist

## 2017-06-04 ENCOUNTER — Ambulatory Visit
Admission: RE | Admit: 2017-06-04 | Discharge: 2017-06-04 | Disposition: A | Discharge status: Home / Self Care | Attending: Mental Health | Admitting: Mental Health

## 2017-06-04 DIAGNOSIS — Z21 Asymptomatic human immunodeficiency virus [HIV] infection status: Principal | ICD-10-CM

## 2017-06-04 DIAGNOSIS — B20 Human immunodeficiency virus [HIV] disease: Secondary | ICD-10-CM

## 2017-06-04 MED ORDER — BICTEGRAVIR 50 MG-EMTRICITABINE 200 MG-TENOFOVIR ALAFENAM 25 MG TABLET
ORAL_TABLET | Freq: Every day | ORAL | 5 refills | 0 days | Status: CP
Start: 2017-06-04 — End: 2017-07-04

## 2017-07-04 ENCOUNTER — Ambulatory Visit
Admission: RE | Admit: 2017-07-04 | Discharge: 2017-07-04 | Disposition: A | Discharge status: Home / Self Care | Attending: Specialist | Admitting: Specialist

## 2017-07-04 DIAGNOSIS — B20 Human immunodeficiency virus [HIV] disease: Principal | ICD-10-CM

## 2017-07-04 MED ORDER — BICTEGRAVIR 50 MG-EMTRICITABINE 200 MG-TENOFOVIR ALAFENAM 25 MG TABLET
ORAL_TABLET | Freq: Every day | ORAL | 5 refills | 0.00000 days | Status: CP
Start: 2017-07-04 — End: 2018-01-17

## 2017-07-04 MED ORDER — FLUTICASONE PROPIONATE 50 MCG/ACTUATION NASAL SPRAY,SUSPENSION
Freq: Every day | NASAL | 2 refills | 0 days | Status: CP
Start: 2017-07-04 — End: 2018-06-24

## 2017-11-02 ENCOUNTER — Emergency Department (HOSPITAL_COMMUNITY): Payer: Self-pay

## 2017-11-02 ENCOUNTER — Emergency Department (HOSPITAL_COMMUNITY)
Admission: EM | Admit: 2017-11-02 | Discharge: 2017-11-02 | Disposition: A | Discharge status: Home / Self Care | Payer: Self-pay | Attending: Emergency Medicine | Admitting: Emergency Medicine

## 2017-11-02 ENCOUNTER — Encounter (HOSPITAL_COMMUNITY): Payer: Self-pay

## 2017-11-02 ENCOUNTER — Other Ambulatory Visit: Payer: Self-pay

## 2017-11-02 DIAGNOSIS — M545 Low back pain, unspecified: Secondary | ICD-10-CM

## 2017-11-02 DIAGNOSIS — R05 Cough: Secondary | ICD-10-CM | POA: Insufficient documentation

## 2017-11-02 DIAGNOSIS — R059 Cough, unspecified: Secondary | ICD-10-CM

## 2017-11-02 HISTORY — DX: Human immunodeficiency virus (HIV) disease: B20

## 2017-11-02 LAB — URINALYSIS, ROUTINE W REFLEX MICROSCOPIC
BILIRUBIN URINE: NEGATIVE
GLUCOSE, UA: NEGATIVE mg/dL
Hgb urine dipstick: NEGATIVE
KETONES UR: NEGATIVE mg/dL
Leukocytes, UA: NEGATIVE
Nitrite: NEGATIVE
PH: 5 (ref 5.0–8.0)
PROTEIN: NEGATIVE mg/dL
Specific Gravity, Urine: 1.021 (ref 1.005–1.030)

## 2017-11-02 LAB — POC URINE PREG, ED: Preg Test, Ur: NEGATIVE

## 2017-11-02 MED ORDER — KETOROLAC TROMETHAMINE 30 MG/ML IJ SOLN
30.0000 mg | Freq: Once | INTRAMUSCULAR | Status: AC
  Administered 2017-11-02: 30 mg via INTRAVENOUS
  Filled 2017-11-02: qty 1

## 2017-11-02 MED ORDER — NAPROXEN 500 MG PO TABS: 500.0000 mg | ORAL_TABLET | Freq: Two times a day (BID) | ORAL | 0 refills | Status: DC | PRN

## 2017-11-02 MED ORDER — BENZONATATE 100 MG PO CAPS: 100.0000 mg | ORAL_CAPSULE | Freq: Three times a day (TID) | ORAL | 0 refills | Status: DC | PRN

## 2017-11-02 NOTE — ED Provider Notes (Signed)
[facility] Provider Note   CSN: [id number] Arrival date & time: 11/02/17  [id number]     History   Chief Complaint No chief complaint on file.   HPI Jamie Heath is a 26 y.o. female.  The history is provided by the patient and medical records. No language interpreter was used.   Jamie Heath is a 26 y.o. female  with a PMH of HIV who presents to the Emergency Department complaining of left mid to low back pain x 3 days. Pain is worse with movement and with coughing. Better when still. Patient states that she has been dealing with cough and congestion x 1 week. Cough was initially productive, but now dry. She feels her cough/congestion have been improving over the last few days. No abdominal pain or flank pain. No fever, chills. No dysuria, urinary urgency/frequency, vaginal discharge, nausea, vomiting, chest pain or trouble breathing.  Patient states that she is on daily medication for HIV and taking as prescribed.   Past Medical History:  Diagnosis Date  . HIV (human immunodeficiency virus infection) [facility](HCC)     Patient Active Problem List   Diagnosis Date Noted  . Morbid obesity (HCC) 10/21/2015  . Abscess of flank 09/30/2015    Past Surgical History:  Procedure Laterality Date  . IRRIGATION AND DEBRIDEMENT ABSCESS Right 09/30/2015   Procedure: IRRIGATION AND DEBRIDEMENT FLANK ABSCESS;  Surgeon: Rodman PickleLuke Aaron Kinsinger, MD;  [facility];  Service: General;  Laterality: Right;    OB History    No data available       Home Medications    Prior to Admission medications   Medication Sig Start Date End Date Taking? Authorizing Provider  benzonatate (TESSALON) 100 MG capsule Take 1 capsule (100 mg total) by mouth 3 (three) times daily as needed for cough. 11/02/17   Kaison Mcparland, Chase PicketJaime Pilcher, PA-C  naproxen (NAPROSYN) 500 MG tablet Take 1 tablet (500 mg total) by mouth 2 (two) times daily as needed for mild pain or moderate  pain. 11/02/17   Edmonia Gonser, Chase PicketJaime Pilcher, PA-C  oxyCODONE (OXY IR/ROXICODONE) 5 MG immediate release tablet Take 1-2 tablets (5-10 mg total) by mouth every 4 (four) hours as needed for moderate pain. Patient not taking: Reported on 10/21/2015 10/03/15   Riebock, Anette RiedelEmina, NP  sulfamethoxazole-trimethoprim (BACTRIM DS,SEPTRA DS) 800-160 MG tablet Take 1 tablet by mouth 2 (two) times daily. Patient not taking: Reported on 10/21/2015 10/03/15   Ashok Norrisiebock, Emina, NP    Family History No family history on file.  Social History Social History   Tobacco Use  . Smoking status: Never Smoker  Substance Use Topics  . Alcohol use: No  . Drug use: No     Allergies   Patient has no known allergies.   Review of Systems Review of Systems  HENT: Positive for congestion. Negative for sore throat.   Respiratory: Positive for cough. Negative for shortness of breath.   Musculoskeletal: Positive for back pain.  All other systems reviewed and are negative.    Physical Exam Updated Vital Signs BP 121/77 (BP Location: Left Arm)   Pulse 95   Temp 98.4 F (36.9 C) (Oral)   Resp 18   Ht 5\' 2"  (1.575 m)   Wt 102.1 kg (225 lb)   LMP 10/25/2017   SpO2 97%   BMI 41.15 kg/m   Physical Exam  Constitutional: She is oriented to person, place, and time. She appears well-developed and well-nourished. No distress.  HENT:  Head:  Normocephalic and atraumatic.  OP clear and moist. + nasal congestion. No focal areas of sinus tenderness.  Cardiovascular: Normal rate, regular rhythm and normal heart sounds.  No murmur heard. Pulmonary/Chest: Effort normal and breath sounds normal. No respiratory distress. She has no wheezes. She has no rales.  Abdominal: Soft. She exhibits no distension. There is no tenderness.  Musculoskeletal:       Arms: TTP as depicted in image. No CVA tenderness. No midline C/T/L spine tenderness. 5/5 muscle strength to LE's.  Straight leg raises are negative bilaterally for radicular  symptoms.  2+ DP.  Sensation intact.  Neurological: She is alert and oriented to person, place, and time.  Skin: Skin is warm and dry.  Nursing note and vitals reviewed.    ED Treatments / Results  Labs (all labs ordered are listed, but only abnormal results are displayed) Labs Reviewed  URINALYSIS, ROUTINE W REFLEX MICROSCOPIC - Abnormal; Notable for the following components:      Result Value   APPearance HAZY (*)    All other components within normal limits  POC URINE PREG, ED    EKG  EKG Interpretation None       Radiology Dg Chest 2 View  Result Date: 11/02/2017 CLINICAL DATA:  Productive cough for several days EXAM: CHEST  2 VIEW COMPARISON:  None. FINDINGS: The heart size and mediastinal contours are within normal limits. Both lungs are clear. The visualized skeletal structures are unremarkable. IMPRESSION: No active cardiopulmonary disease. Electronically Signed   By: Alcide CleverMark  Lukens M.D.   On: 11/02/2017 11:34    Procedures Procedures (including critical care time)  Medications Ordered in ED Medications  ketorolac (TORADOL) 30 MG/ML injection 30 mg (30 mg Intravenous Given 11/02/17 1136)     Initial Impression / Assessment and Plan / ED Course  I have reviewed the triage vital signs and the nursing notes.  Pertinent labs & imaging results that were available during my care of the patient were reviewed by me and considered in my medical decision making (see chart for details).    Jamie Heath is a 26 y.o. female who presents to ED for left-sided back pain x 3 days. Cough, congestion x 1 week. No red flag symptoms of back pain and no midline tenderness on exam. Doubt cauda equina or infectious etiology. No urinary symptoms. No abdominal, flank or CVA tenderness on exam. Doubt stone with no tenderness and aforementioned and no blood in the urine. UA with no signs of infection, therefore UTI / pyelo unlikely. Afebrile, hemodynamically stable. Given recent URI  symptoms, PNA considered but with clear lungs and negative cxr, unlikely PNA. Will treat symptomatically and have patient follow up with PCP. Return precautions discussed and all questions answered.   Patient discussed with Dr. Freida BusmanAllen who agrees with treatment plan.    Final Clinical Impressions(s) / ED Diagnoses   Final diagnoses:  Cough  Acute left-sided low back pain without sciatica    ED Discharge Orders        Ordered    naproxen (NAPROSYN) 500 MG tablet  2 times daily PRN     11/02/17 1143    benzonatate (TESSALON) 100 MG capsule  3 times daily PRN     11/02/17 1150       Santita Hunsberger, Chase PicketJaime Pilcher, PA-C 11/02/17 1540    Lorre NickAllen, Anthony, MD 11/05/17 1447

## 2017-11-02 NOTE — ED Triage Notes (Signed)
Patient complains of increasing lower back and left flank pain x 3 days, denies trauma. Denies dysuria

## 2017-11-02 NOTE — Discharge Instructions (Signed)
It was my pleasure taking care of you today!   Naproxen as needed for mild to moderate pain. Ice or heat to the affected area as needed.   Please follow up with your primary care provider. Call Monday morning to schedule an appointment.   Return to ER for fever, vomiting, new or worsening symptoms, any additional concerns.

## 2017-11-02 NOTE — ED Notes (Signed)
Patient transported to X-ray 

## 2017-11-02 NOTE — ED Notes (Signed)
ED Provider at bedside. 

## 2017-11-02 NOTE — ED Notes (Signed)
Pt returned from X Ray.

## 2017-11-20 ENCOUNTER — Ambulatory Visit: Payer: Self-pay

## 2017-12-04 ENCOUNTER — Ambulatory Visit: Payer: Self-pay | Attending: Oncology

## 2017-12-04 ENCOUNTER — Ambulatory Visit
Admission: RE | Admit: 2017-12-04 | Discharge: 2017-12-04 | Disposition: A | Discharge status: Home / Self Care | Payer: Self-pay | Source: Ambulatory Visit | Attending: Oncology | Admitting: Oncology

## 2017-12-04 ENCOUNTER — Other Ambulatory Visit: Payer: Self-pay

## 2017-12-04 VITALS — BP 123/80 | HR 93 | Temp 98.4°F | Ht 65.0 in | Wt 270.0 lb

## 2017-12-04 DIAGNOSIS — N6452 Nipple discharge: Secondary | ICD-10-CM

## 2017-12-04 HISTORY — DX: Nipple discharge: N64.52

## 2017-12-04 NOTE — Progress Notes (Signed)
Patient works at [employer] [place].  Radiologist reviewed Birads 1 Ultrasound result of right breast with patient.  Patient saw Dr. Valentino Saxonherry at [facility] on 12/12/17.  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 Per Dr. Oretha Milchherry's note in lab report, patient to return for BCCCP pap in one year.  Scheduled for pap on 12/03/18 at 3:00.  Mailed appointment info. Copy to [facility].

## 2017-12-04 NOTE — Progress Notes (Signed)
Subjective:     Patient ID: Jamie BoringLeticia Heath Heath, female   DOB: March 06, 1991, 27 y.o.   MRN: [id number]  HPI   Review of Systems     Objective:   Physical Exam  Pulmonary/Chest: Right breast exhibits nipple discharge. Right breast exhibits no inverted nipple, no mass, no skin change and no tenderness. Left breast exhibits no inverted nipple, no mass, no nipple discharge, no skin change and no tenderness. Breasts are symmetrical.  Right nipple milky discharge on expression       Assessment:  27 year old hispanic patient presents for BCCCP clinic visit.  Jamie HaringLoyda Heath interprets exam.  Patient had an abnormal pap with LSIL results.  HPV not tested.  Patient is having right nipple discharge, "white" in color.   Patient screened, and meets BCCCP eligibility.  Patient does not have insurance, Medicare or Medicaid.  Handout given on Affordable Care Act.Instructed patient on breast self-exam using teach back method. CBE unremarkable.  No mass or lump.  Nipple discharge only on expression.  Never spontaneous.  Able to express white discharge.      Plan:     Scheduled GYN appointment at [facility] on 12/09/17.  Sent for bilateral breast ultrasound.

## 2017-12-09 ENCOUNTER — Encounter: Payer: Self-pay | Admitting: Obstetrics and Gynecology

## 2017-12-12 ENCOUNTER — Other Ambulatory Visit: Payer: Self-pay | Admitting: Obstetrics and Gynecology

## 2017-12-12 ENCOUNTER — Encounter: Payer: Self-pay | Admitting: Obstetrics and Gynecology

## 2017-12-12 ENCOUNTER — Ambulatory Visit (INDEPENDENT_AMBULATORY_CARE_PROVIDER_SITE_OTHER): Payer: PRIVATE HEALTH INSURANCE | Admitting: Obstetrics and Gynecology

## 2017-12-12 VITALS — BP 113/77 | HR 99 | Ht 65.0 in | Wt 272.0 lb

## 2017-12-12 DIAGNOSIS — R87612 Low grade squamous intraepithelial lesion on cytologic smear of cervix (LGSIL): Secondary | ICD-10-CM | POA: Diagnosis not present

## 2017-12-12 DIAGNOSIS — B20 Human immunodeficiency virus [HIV] disease: Secondary | ICD-10-CM

## 2017-12-12 NOTE — Progress Notes (Signed)
    GYNECOLOGY CLINIC COLPOSCOPY PROCEDURE NOTE  27 y.o. G0P0000 with a PMH of HIV here for colposcopy for low-grade squamous intraepithelial neoplasia (LGSIL - encompassing HPV,mild dysplasia,CIN I) pap smear around 12/04/2017. Discussed role for HPV in cervical dysplasia, need for surveillance.  Patient given informed consent, signed copy in the chart, time out was performed.  Placed in lithotomy position. Cervix viewed with speculum and colposcope after application of acetic acid.   Colposcopy adequate? Yes  no mosaicism, no punctation, no abnormal vasculature and acetowhite lesion(s) noted at 12-2 o'clock, 3-4 o'clock, and 9 o'clock; corresponding biopsies obtained.  ECC specimen obtained. All specimens were labeled and sent to pathology.   Patient was given post procedure instructions.  Will follow up pathology and manage accordingly; patient will be contacted with results and recommendations.  Routine preventative health maintenance measures emphasized.    Hildred Laserherry, Chelise Hanger, MD [facility]

## 2017-12-12 NOTE — Progress Notes (Signed)
Pt is here for a colposcopy. Pap smear was LGSIL but HPV was not tested.

## 2017-12-12 NOTE — Patient Instructions (Addendum)
Colposcopía - Cuidados posteriores  (Colposcopy, Care After)  Siga estas instrucciones durante las próximas semanas. Estas indicaciones le proporcionan información general acerca de cómo deberá cuidarse después del procedimiento. El médico también podrá darle instrucciones más específicas. El tratamiento se ha planificado de acuerdo a las prácticas médicas actuales, pero a veces se producen problemas. Comuníquese con el médico si tiene algún problema o tiene dudas después del procedimiento.  QUÉ ESPERAR DESPUÉS DEL PROCEDIMIENTO   Después del procedimiento, es típico tener las siguientes sensaciones:  · Cólicos. Generalmente se calman en algunos minutos.  · Dolor. Puede durar hasta dos días.  · Aturdimiento. Si esto le ocurre, recuéstese durante algunos minutos.  Podrá tener un sangrado leve o una secreción oscura que debe detenerse en algunos días. Durante este tiempo deberá usar un apósito sanitario.  INSTRUCCIONES PARA EL CUIDADO EN EL HOGAR  · Evite las relaciones sexuales, las duchas vaginales y el uso de tampones durante 3 días, o según lo que le indique su médico.  · Tome sólo medicamentos de venta libre o recetados, según las indicaciones del médico. No tome aspirina, ya que puede causar hemorragias.  · Si utiliza píldoras anticonceptivas, continúe tomándolas.  · No todos los resultados estarán disponibles durante su visita. En este caso, tenga otra entrevista con su médico para conocerlos. No suponga que es normal si no tiene noticias de su médico o del establecimiento de salud. Es importante el seguimiento de todos los resultados de los estudios.  · Siga los consejos de su médico con respecto a los medicamentos, actividades, visitas y [place] de control.  SOLICITE ATENCIÓN MÉDICA SI:  · Aparece una erupción cutánea.  · Tiene problemas con los medicamentos.  SOLICITE ATENCIÓN MÉDICA DE INMEDIATO SI:  · Tiene una hemorragia abundante o elimina coágulos.  · Tiene fiebre.  · Tiene flujo vaginal  anormal.  · Tiene cólicos que no se alivian luego de tomar analgésicos.  · Se siente mareada, tiene vahídos o se desmaya.  · Siente dolor en el estómago.  Esta información no tiene como fin reemplazar el consejo del médico. Asegúrese de hacerle al médico cualquier pregunta que tenga.  Document Released: 08/26/2013  Elsevier Interactive Patient Education © 2017 [employer]vier Inc.

## 2017-12-17 LAB — PATHOLOGY

## 2017-12-18 IMAGING — CR DG CHEST 2V
2 series · 2 of 2 positions shown · non-contrast
Comparison: None.

CLINICAL DATA: Productive cough for several days

EXAM:
CHEST  2 VIEW

[chest pa]
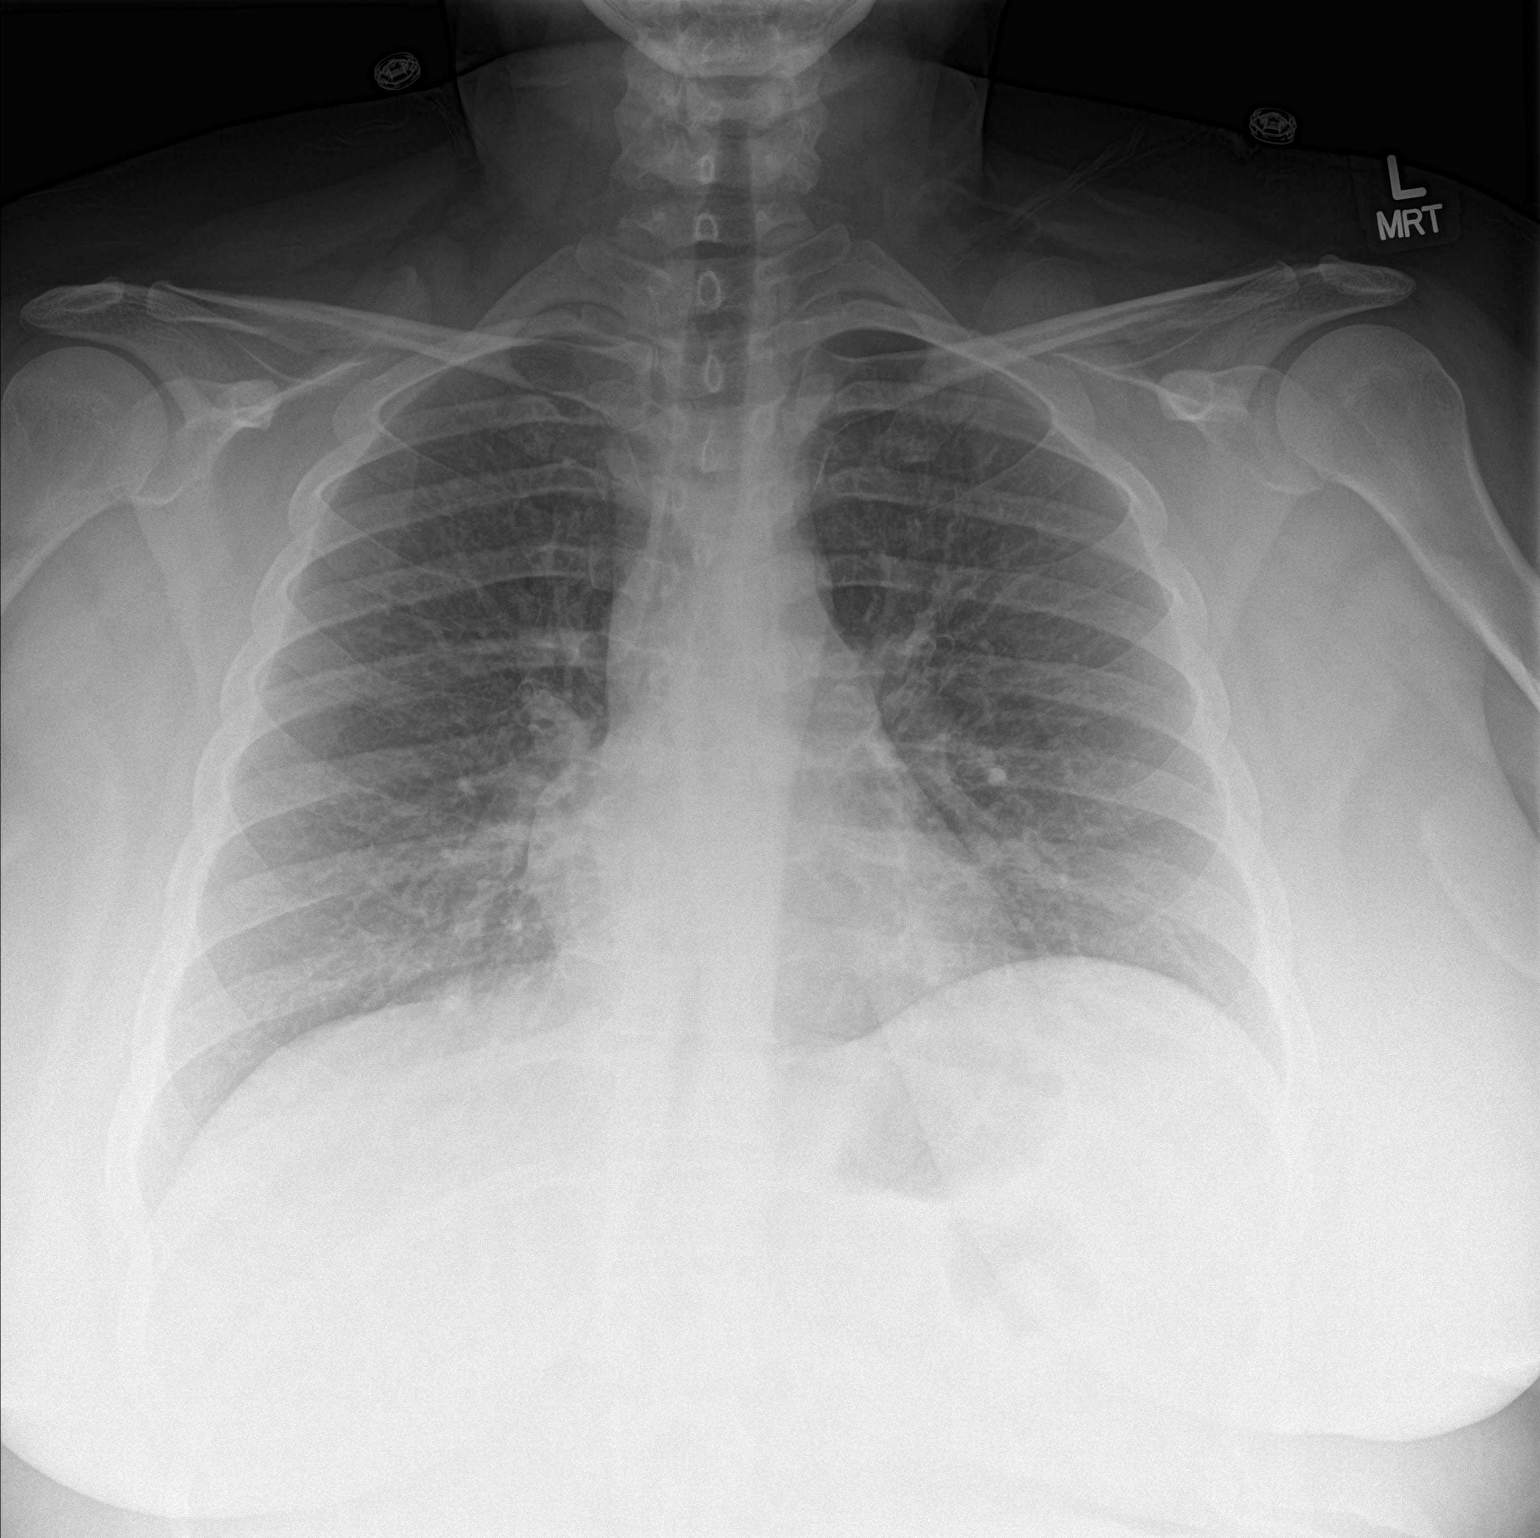

[chest lat]
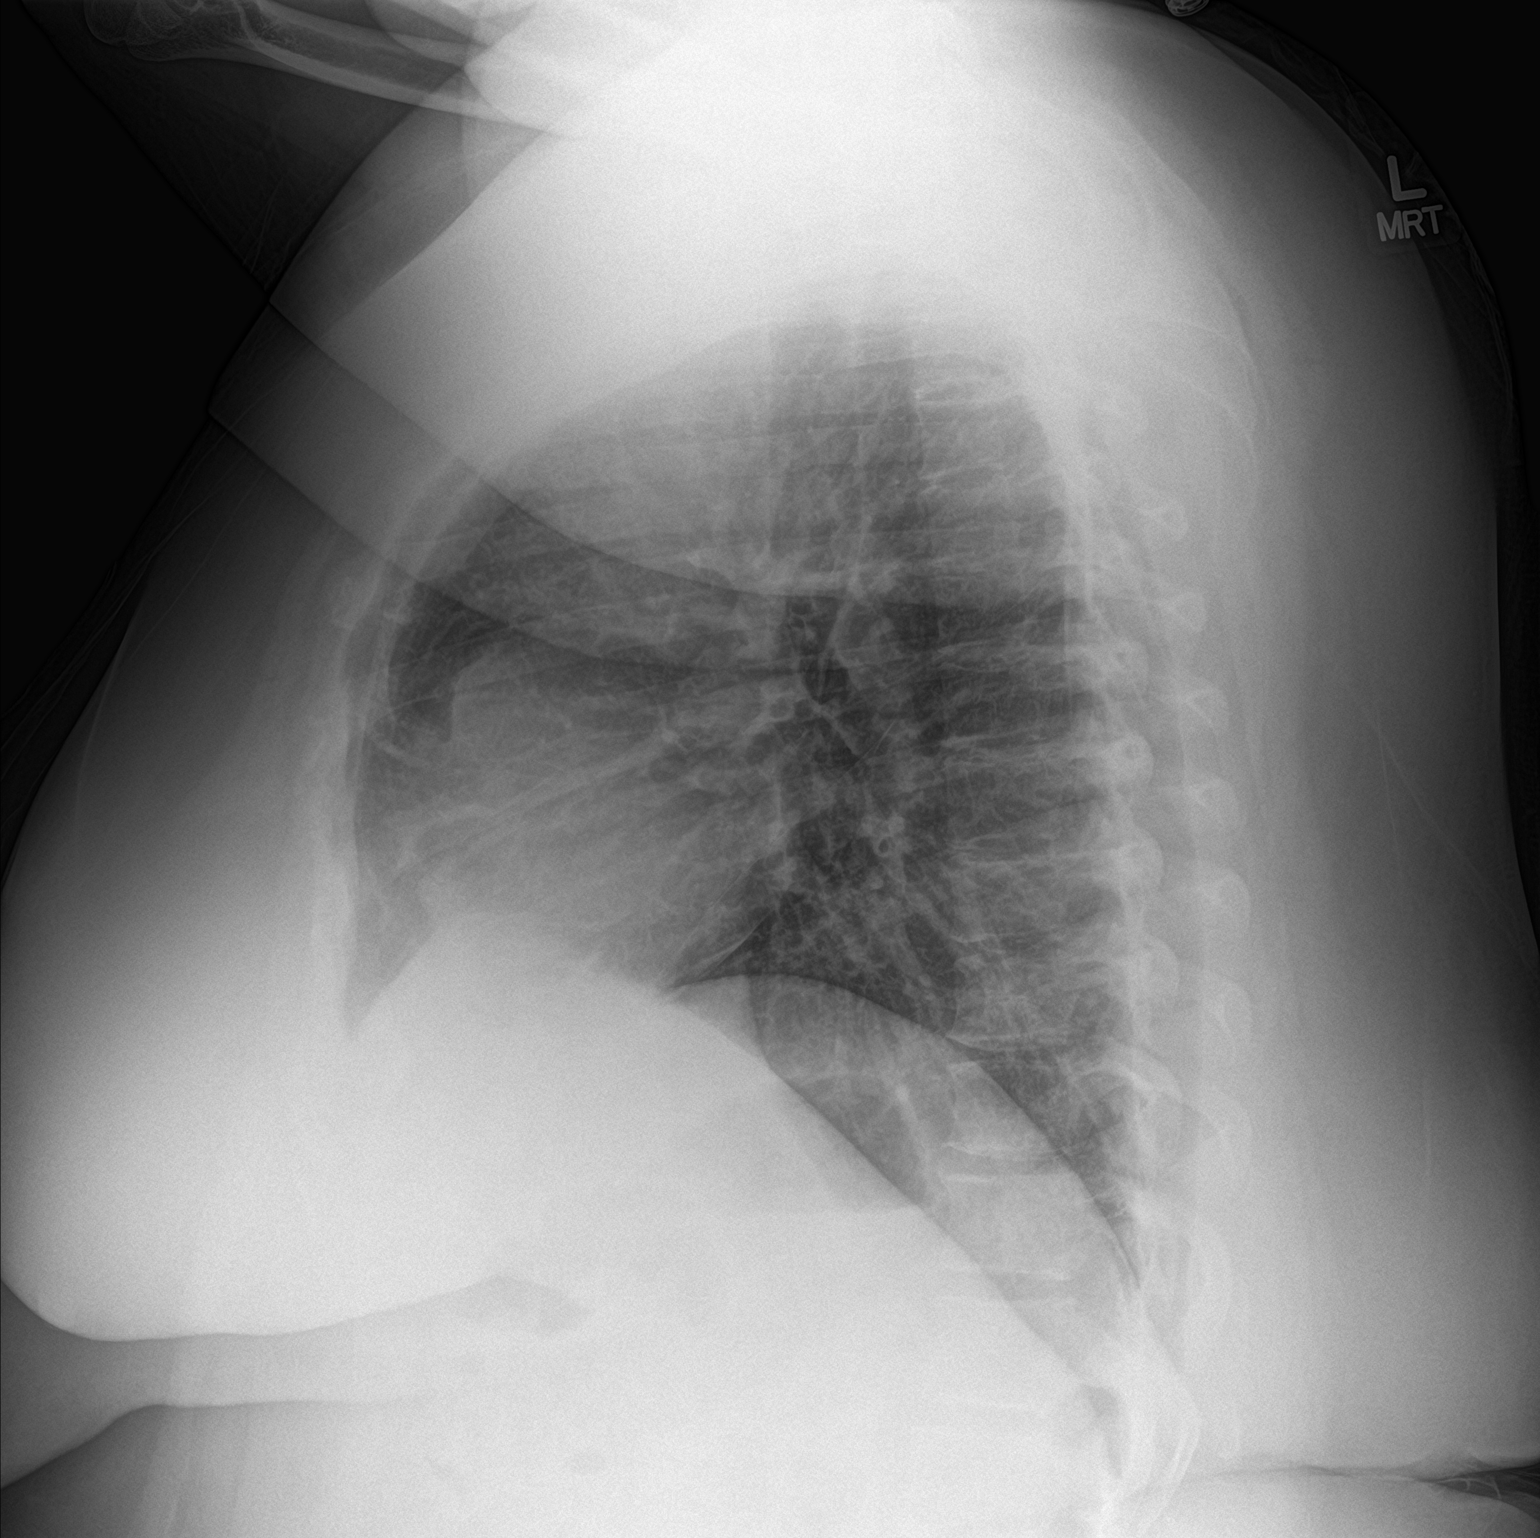

[2 of 2 positions shown; findings below may reference images not displayed]

FINDINGS: The heart size and mediastinal contours are within normal limits.
Both lungs are clear. The visualized skeletal structures are
unremarkable.
IMPRESSION: No active cardiopulmonary disease.

## 2018-01-17 MED ORDER — BICTEGRAVIR 50 MG-EMTRICITABINE 200 MG-TENOFOVIR ALAFENAM 25 MG TABLET: 1 | tablet | Freq: Every day | 5 refills | 0 days | Status: AC

## 2018-01-17 MED ORDER — BICTEGRAVIR 50 MG-EMTRICITABINE 200 MG-TENOFOVIR ALAFENAM 25 MG TABLET
ORAL_TABLET | Freq: Every day | ORAL | 5 refills | 0.00000 days | Status: CP
Start: 2018-01-17 — End: 2018-01-17

## 2018-02-18 ENCOUNTER — Ambulatory Visit: Admit: 2018-02-18 | Discharge: 2018-02-18 | Attending: Specialist | Primary: Specialist

## 2018-02-18 DIAGNOSIS — Z0000 Encounter for general adult medical examination without abnormal findings: Secondary | ICD-10-CM

## 2018-02-18 DIAGNOSIS — B20 Human immunodeficiency virus [HIV] disease: Principal | ICD-10-CM

## 2018-02-18 DIAGNOSIS — E669 Obesity, unspecified: Secondary | ICD-10-CM

## 2018-04-15 ENCOUNTER — Encounter
Admit: 2018-04-15 | Discharge: 2018-04-16 | Payer: Worker's Compensation | Attending: Student in an Organized Health Care Education/Training Program | Primary: Student in an Organized Health Care Education/Training Program

## 2018-04-15 DIAGNOSIS — Z0120 Encounter for dental examination and cleaning without abnormal findings: Secondary | ICD-10-CM

## 2018-04-15 DIAGNOSIS — B20 Human immunodeficiency virus [HIV] disease: Principal | ICD-10-CM

## 2018-04-15 DIAGNOSIS — E669 Obesity, unspecified: Secondary | ICD-10-CM

## 2018-04-16 ENCOUNTER — Encounter: Admit: 2018-04-16 | Discharge: 2018-04-17 | Payer: Worker's Compensation

## 2018-04-16 DIAGNOSIS — Z0120 Encounter for dental examination and cleaning without abnormal findings: Principal | ICD-10-CM

## 2018-06-24 ENCOUNTER — Ambulatory Visit: Admit: 2018-06-24 | Discharge: 2018-06-25 | Attending: Specialist | Primary: Specialist

## 2018-06-24 DIAGNOSIS — Z0000 Encounter for general adult medical examination without abnormal findings: Secondary | ICD-10-CM

## 2018-06-24 DIAGNOSIS — B20 Human immunodeficiency virus [HIV] disease: Principal | ICD-10-CM

## 2018-06-24 DIAGNOSIS — E669 Obesity, unspecified: Secondary | ICD-10-CM

## 2018-06-24 DIAGNOSIS — R945 Abnormal results of liver function studies: Secondary | ICD-10-CM

## 2018-07-02 ENCOUNTER — Ambulatory Visit: Admit: 2018-07-02 | Discharge: 2018-07-03

## 2018-07-02 DIAGNOSIS — R634 Abnormal weight loss: Principal | ICD-10-CM

## 2018-07-02 DIAGNOSIS — R945 Abnormal results of liver function studies: Secondary | ICD-10-CM

## 2018-07-22 MED ORDER — DEXTROMETHORPHAN-GUAIFENESIN ER 60 MG-1,200 MG TAB,EXTEND RELEASE,12HR
Freq: Two times a day (BID) | ORAL | 1 refills | 0.00000 days | Status: CP
Start: 2018-07-22 — End: 2018-07-27

## 2018-11-25 ENCOUNTER — Ambulatory Visit: Admit: 2018-11-25 | Discharge: 2018-11-26 | Attending: Specialist | Primary: Specialist

## 2018-11-25 DIAGNOSIS — Z0000 Encounter for general adult medical examination without abnormal findings: Secondary | ICD-10-CM

## 2018-11-25 DIAGNOSIS — R87619 Unspecified abnormal cytological findings in specimens from cervix uteri: Secondary | ICD-10-CM

## 2018-11-25 DIAGNOSIS — N921 Excessive and frequent menstruation with irregular cycle: Secondary | ICD-10-CM

## 2018-11-25 DIAGNOSIS — B20 Human immunodeficiency virus [HIV] disease: Principal | ICD-10-CM

## 2018-12-03 ENCOUNTER — Ambulatory Visit: Payer: Self-pay | Attending: Oncology

## 2018-12-29 MED ORDER — BIKTARVY 50 MG-200 MG-25 MG TABLET
ORAL_TABLET | Freq: Every day | ORAL | 5 refills | 0.00000 days | Status: CP
Start: 2018-12-29 — End: 2019-02-17

## 2019-02-17 ENCOUNTER — Ambulatory Visit: Admit: 2019-02-17 | Discharge: 2019-02-18 | Attending: Specialist | Primary: Specialist

## 2019-02-17 DIAGNOSIS — B20 Human immunodeficiency virus [HIV] disease: Principal | ICD-10-CM

## 2019-02-17 DIAGNOSIS — E669 Obesity, unspecified: Principal | ICD-10-CM

## 2019-02-17 MED ORDER — DOVATO 50 MG-300 MG TABLET
ORAL_TABLET | Freq: Every day | ORAL | 11 refills | 0 days | Status: CP
Start: 2019-02-17 — End: ?

## 2019-04-08 ENCOUNTER — Ambulatory Visit: Admit: 2019-04-08 | Discharge: 2019-04-09 | Attending: Medical | Primary: Medical

## 2019-04-08 ENCOUNTER — Encounter: Admit: 2019-04-08 | Discharge: 2019-04-08 | Attending: Medical | Primary: Medical

## 2019-04-08 DIAGNOSIS — R87619 Unspecified abnormal cytological findings in specimens from cervix uteri: Principal | ICD-10-CM

## 2019-04-08 DIAGNOSIS — N879 Dysplasia of cervix uteri, unspecified: Secondary | ICD-10-CM

## 2019-09-29 ENCOUNTER — Ambulatory Visit: Admit: 2019-09-29 | Discharge: 2019-09-30 | Attending: Specialist | Primary: Specialist

## 2019-09-29 ENCOUNTER — Encounter: Admit: 2019-09-29 | Discharge: 2019-09-30 | Attending: Specialist | Primary: Specialist

## 2019-09-29 DIAGNOSIS — E669 Obesity, unspecified: Principal | ICD-10-CM

## 2019-09-29 DIAGNOSIS — B20 Human immunodeficiency virus [HIV] disease: Principal | ICD-10-CM

## 2019-09-29 DIAGNOSIS — Z0000 Encounter for general adult medical examination without abnormal findings: Principal | ICD-10-CM

## 2019-09-29 MED ORDER — DOVATO 50 MG-300 MG TABLET
ORAL_TABLET | Freq: Every day | ORAL | 11 refills | 0 days | Status: CP
Start: 2019-09-29 — End: ?

## 2019-10-08 MED ORDER — METFORMIN 500 MG TABLET
ORAL_TABLET | 11 refills | 0 days | Status: CP
Start: 2019-10-08 — End: ?

## 2019-10-27 ENCOUNTER — Institutional Professional Consult (permissible substitution): Admit: 2019-10-27 | Discharge: 2019-10-28 | Attending: Registered" | Primary: Registered"

## 2019-10-27 DIAGNOSIS — R7309 Other abnormal glucose: Principal | ICD-10-CM

## 2019-11-10 ENCOUNTER — Ambulatory Visit: Admit: 2019-11-10 | Discharge: 2019-11-11 | Attending: Registered" | Primary: Registered"

## 2019-11-10 DIAGNOSIS — E669 Obesity, unspecified: Principal | ICD-10-CM

## 2019-11-27 ENCOUNTER — Institutional Professional Consult (permissible substitution): Admit: 2019-11-27 | Discharge: 2019-11-28 | Attending: Registered" | Primary: Registered"

## 2020-02-19 ENCOUNTER — Ambulatory Visit: Payer: Self-pay

## 2020-02-29 ENCOUNTER — Institutional Professional Consult (permissible substitution): Admit: 2020-02-29 | Discharge: 2020-03-01 | Attending: Registered" | Primary: Registered"

## 2020-03-30 ENCOUNTER — Ambulatory Visit: Admit: 2020-03-30 | Discharge: 2020-03-31 | Attending: Registered" | Primary: Registered"

## 2020-03-30 DIAGNOSIS — E669 Obesity, unspecified: Principal | ICD-10-CM

## 2020-04-12 ENCOUNTER — Ambulatory Visit: Admit: 2020-04-12 | Discharge: 2020-04-12

## 2020-04-12 DIAGNOSIS — B20 Human immunodeficiency virus [HIV] disease: Principal | ICD-10-CM

## 2020-04-12 DIAGNOSIS — E119 Type 2 diabetes mellitus without complications: Principal | ICD-10-CM

## 2020-04-12 DIAGNOSIS — R05 Cough: Principal | ICD-10-CM

## 2020-04-20 ENCOUNTER — Ambulatory Visit: Admit: 2020-04-20 | Discharge: 2020-04-21 | Attending: Registered" | Primary: Registered"

## 2020-04-20 DIAGNOSIS — E669 Obesity, unspecified: Principal | ICD-10-CM

## 2020-04-28 ENCOUNTER — Ambulatory Visit: Admit: 2020-04-28 | Attending: Obstetrics & Gynecology | Primary: Obstetrics & Gynecology

## 2020-05-10 ENCOUNTER — Ambulatory Visit: Admit: 2020-05-10

## 2020-05-27 ENCOUNTER — Ambulatory Visit: Admit: 2020-05-27 | Attending: Obstetrics & Gynecology | Primary: Obstetrics & Gynecology

## 2020-06-20 ENCOUNTER — Institutional Professional Consult (permissible substitution): Admit: 2020-06-20 | Discharge: 2020-06-21 | Attending: Registered" | Primary: Registered"

## 2020-06-21 ENCOUNTER — Ambulatory Visit: Admit: 2020-06-21 | Discharge: 2020-06-22

## 2020-08-22 ENCOUNTER — Ambulatory Visit: Admit: 2020-08-22 | Attending: Registered" | Primary: Registered"

## 2020-09-20 DIAGNOSIS — B20 Human immunodeficiency virus [HIV] disease: Principal | ICD-10-CM

## 2020-09-20 MED ORDER — DOVATO 50 MG-300 MG TABLET
ORAL_TABLET | Freq: Every day | ORAL | 11 refills | 0 days | Status: CP
Start: 2020-09-20 — End: ?

## 2020-10-29 MED ORDER — METFORMIN 500 MG TABLET
ORAL_TABLET | 2 refills | 0.00000 days | Status: CP
Start: 2020-10-29 — End: ?

## 2020-11-01 ENCOUNTER — Telehealth: Admit: 2020-11-01 | Discharge: 2020-11-02 | Attending: Registered" | Primary: Registered"

## 2020-11-22 ENCOUNTER — Ambulatory Visit: Admit: 2020-11-22 | Discharge: 2020-11-23

## 2020-11-22 DIAGNOSIS — R768 Other specified abnormal immunological findings in serum: Principal | ICD-10-CM

## 2020-11-22 DIAGNOSIS — B20 Human immunodeficiency virus [HIV] disease: Principal | ICD-10-CM

## 2020-11-22 DIAGNOSIS — R002 Palpitations: Principal | ICD-10-CM

## 2020-11-29 ENCOUNTER — Ambulatory Visit: Admit: 2020-11-29 | Discharge: 2020-11-30

## 2020-11-29 DIAGNOSIS — J029 Acute pharyngitis, unspecified: Principal | ICD-10-CM

## 2020-11-29 DIAGNOSIS — Z20822 Contact with and (suspected) exposure to covid-19: Principal | ICD-10-CM

## 2020-12-22 ENCOUNTER — Telehealth: Admit: 2020-12-22 | Discharge: 2020-12-23 | Attending: Registered" | Primary: Registered"

## 2020-12-22 DIAGNOSIS — E669 Obesity, unspecified: Principal | ICD-10-CM

## 2021-01-18 MED ORDER — METFORMIN 500 MG TABLET
ORAL_TABLET | 2 refills | 0 days | Status: CP
Start: 2021-01-18 — End: ?

## 2021-02-24 ENCOUNTER — Ambulatory Visit: Admit: 2021-02-24 | Discharge: 2021-02-25 | Attending: Obstetrics & Gynecology | Primary: Obstetrics & Gynecology

## 2021-02-24 DIAGNOSIS — Z6841 Body Mass Index (BMI) 40.0 and over, adult: Principal | ICD-10-CM

## 2021-02-24 DIAGNOSIS — B20 Human immunodeficiency virus [HIV] disease: Principal | ICD-10-CM

## 2021-02-24 DIAGNOSIS — N926 Irregular menstruation, unspecified: Principal | ICD-10-CM

## 2021-02-24 DIAGNOSIS — R87619 Unspecified abnormal cytological findings in specimens from cervix uteri: Principal | ICD-10-CM

## 2021-03-07 ENCOUNTER — Ambulatory Visit: Admit: 2021-03-07

## 2021-03-27 ENCOUNTER — Telehealth: Admit: 2021-03-27 | Discharge: 2021-03-28 | Attending: Registered" | Primary: Registered"

## 2021-04-07 MED ORDER — METFORMIN 500 MG TABLET
ORAL_TABLET | 5 refills | 0 days | Status: CP
Start: 2021-04-07 — End: ?

## 2021-05-08 ENCOUNTER — Ambulatory Visit: Admit: 2021-05-08 | Discharge: 2021-05-09 | Attending: Registered" | Primary: Registered"

## 2021-06-02 ENCOUNTER — Ambulatory Visit: Admit: 2021-06-02 | Discharge: 2021-06-03

## 2021-06-09 ENCOUNTER — Ambulatory Visit: Admit: 2021-06-09 | Discharge: 2021-06-09 | Attending: Registered" | Primary: Registered"

## 2021-06-09 ENCOUNTER — Ambulatory Visit: Admit: 2021-06-09 | Discharge: 2021-06-09 | Attending: Obstetrics & Gynecology | Primary: Obstetrics & Gynecology

## 2021-06-09 DIAGNOSIS — Z6841 Body Mass Index (BMI) 40.0 and over, adult: Principal | ICD-10-CM

## 2021-06-09 DIAGNOSIS — B20 Human immunodeficiency virus [HIV] disease: Principal | ICD-10-CM

## 2021-06-09 DIAGNOSIS — N6011 Diffuse cystic mastopathy of right breast: Principal | ICD-10-CM

## 2021-06-09 DIAGNOSIS — Z3169 Encounter for other general counseling and advice on procreation: Principal | ICD-10-CM

## 2021-06-09 MED ORDER — PRENATAL VITAMIN NO.102-IRON 90 MG-FOLATE 1 MG-DHA 200 MG CAPSULE
ORAL_CAPSULE | Freq: Every day | ORAL | 4 refills | 0 days | Status: CP
Start: 2021-06-09 — End: ?

## 2021-06-20 ENCOUNTER — Ambulatory Visit: Admit: 2021-06-20 | Discharge: 2021-06-21

## 2021-06-20 DIAGNOSIS — B20 Human immunodeficiency virus [HIV] disease: Principal | ICD-10-CM

## 2021-06-20 LAB — COMPREHENSIVE METABOLIC PANEL
ALBUMIN: 4.1 g/dL (ref 3.4–5.0)
ALKALINE PHOSPHATASE: 65 U/L (ref 46–116)
ALT (SGPT): 178 U/L — ABNORMAL HIGH (ref 10–49)
ANION GAP: 7 mmol/L (ref 5–14)
AST (SGOT): 105 U/L — ABNORMAL HIGH (ref <=34)
BILIRUBIN TOTAL: 0.5 mg/dL (ref 0.3–1.2)
BLOOD UREA NITROGEN: 10 mg/dL (ref 9–23)
BUN / CREAT RATIO: 14
CALCIUM: 9.4 mg/dL (ref 8.7–10.4)
CHLORIDE: 105 mmol/L (ref 98–107)
CO2: 24.9 mmol/L (ref 20.0–31.0)
CREATININE: 0.73 mg/dL
EGFR CKD-EPI (2021) FEMALE: >90 mL/min/{1.73_m2} (ref >=60)
GLUCOSE RANDOM: 79 mg/dL (ref 70–179)
POTASSIUM: 3.6 mmol/L (ref 3.4–4.8)
PROTEIN TOTAL: 7.8 g/dL (ref 5.7–8.2)
SODIUM: 137 mmol/L (ref 135–145)

## 2021-06-20 LAB — CBC W/ AUTO DIFF
BASOPHILS ABSOLUTE COUNT: 0.1 10*9/L (ref 0.0–0.1)
BASOPHILS RELATIVE PERCENT: 0.6 %
EOSINOPHILS ABSOLUTE COUNT: 0 10*9/L (ref 0.0–0.5)
EOSINOPHILS RELATIVE PERCENT: 0.5 %
HEMATOCRIT: 42.3 % (ref 34.0–44.0)
HEMOGLOBIN: 14.2 g/dL (ref 11.3–14.9)
LYMPHOCYTES ABSOLUTE COUNT: 3.2 10*9/L (ref 1.1–3.6)
LYMPHOCYTES RELATIVE PERCENT: 39.3 %
MEAN CORPUSCULAR HEMOGLOBIN CONC: 33.4 g/dL (ref 32.0–36.0)
MEAN CORPUSCULAR HEMOGLOBIN: 25.9 pg (ref 25.9–32.4)
MEAN CORPUSCULAR VOLUME: 77.6 fL (ref 77.6–95.7)
MEAN PLATELET VOLUME: 8.3 fL (ref 6.8–10.7)
MONOCYTES ABSOLUTE COUNT: 0.5 10*9/L (ref 0.3–0.8)
MONOCYTES RELATIVE PERCENT: 6.1 %
NEUTROPHILS ABSOLUTE COUNT: 4.4 10*9/L (ref 1.8–7.8)
NEUTROPHILS RELATIVE PERCENT: 53.5 %
NUCLEATED RED BLOOD CELLS: 0 /100{WBCs} (ref <=4)
PLATELET COUNT: 243 10*9/L (ref 150–450)
RED BLOOD CELL COUNT: 5.46 10*12/L — ABNORMAL HIGH (ref 3.95–5.13)
RED CELL DISTRIBUTION WIDTH: 14.5 % (ref 12.2–15.2)
WBC ADJUSTED: 8.2 10*9/L (ref 3.6–11.2)

## 2021-06-20 LAB — LYMPH MARKER LIMITED,FLOW
ABSOLUTE CD3 CNT: 2048 {cells}/uL (ref 915–3400)
ABSOLUTE CD4 CNT: 928 {cells}/uL (ref 510–2320)
ABSOLUTE CD8 CNT: 1088 {cells}/uL (ref 180–1520)
CD3% (T CELLS): 64 % (ref 61–86)
CD4% (T HELPER): 29 % — ABNORMAL LOW (ref 34–58)
CD4:CD8 RATIO: 0.9 (ref 0.9–4.8)
CD8% T SUPPRESR: 34 % (ref 12–38)

## 2021-06-20 LAB — PROTEIN / CREATININE RATIO, URINE
CREATININE, URINE: 93.9 mg/dL
PROTEIN URINE: <6 mg/dL

## 2021-06-20 LAB — HEMOGLOBIN A1C
ESTIMATED AVERAGE GLUCOSE: 126 mg/dL
HEMOGLOBIN A1C: 6 % — ABNORMAL HIGH (ref 4.8–5.6)

## 2021-06-20 MED ORDER — METFORMIN 500 MG TABLET
ORAL_TABLET | Freq: Two times a day (BID) | ORAL | 5 refills | 30.00000 days | Status: CP
Start: 2021-06-20 — End: ?
  Filled 2021-06-22: qty 60, 30d supply, fill #0

## 2021-06-20 MED ORDER — DOLUTEGRAVIR 50 MG TABLET
ORAL_TABLET | Freq: Every day | ORAL | 5 refills | 30.00000 days | Status: CP
Start: 2021-06-20 — End: ?
  Filled 2021-06-22: qty 30, 30d supply, fill #0

## 2021-06-20 MED ORDER — LAMIVUDINE 300 MG TABLET
ORAL_TABLET | Freq: Every day | ORAL | 5 refills | 30 days | Status: CP
Start: 2021-06-20 — End: ?
  Filled 2021-06-22: qty 30, 30d supply, fill #0

## 2021-06-20 NOTE — Unmapped (Addendum)
This onboarding is for the following:  ?? lamivudine 300mg   ?? Tivicay 50mg     [facility]   Patient Onboarding/Medication Counseling    Kimberly Nicholson is a 30 y.o. female with HIV who I am counseling today on initiation of the individual ingredients of Dovato (lamivudine and dolutegravir) of therapy.  I am speaking to the patient.    Was a Nurse, learning disability used for this call? Yes, Spanish Conservation officer, historic buildings [phone]). Patient language is appropriate in [facility]    Verified patient's date of birth / HIPAA.    Specialty medication(s) to be sent: Infectious Disease: lamivudine and Tivicay      Non-specialty medications/supplies to be sent: metformin 500mg       Medications not needed at this time: n/a         Epivir  (lamivudine) 300mg  tablets     The patient declined counseling on medication administration, missed dose instructions, goals of therapy, side effects and monitoring parameters, warnings and precautions, drug/food interactions and storage, handling precautions, and disposal because currently taking Dovato. Will be replaced with lamivudine and  tivicay at the same strenght. The information in the declined sections below are for informational purposes only and was not discussed with patient.       Medication & Administration     Dosage: Take one tablet (300mg ) by mouth once daily    Administration: Take with or without food    Adherence/Missed dose instructions: take missed dose as soon as you remember. If it is close to the time of your next dose, skip the dose and resume with your next scheduled dose.    Goals of Therapy     The goal is to suppress viral replication and keep HIV non-detectable on lab tests    Side Effects & Monitoring Parameters     ?? Headache  ?? Diarrhea  ?? upset stomach or throwing up  ?? Feeling dizzy, tired, or weak  ?? Trouble sleeping  ?? Muscle pain  ?? Nose or throat irritation.   ?? Ear irritation.    The following side effects should be reported to the provider:    ?? Signs of an allergic reaction, like rash; hives; itching; red, swollen, blistered, or peeling skin with or without fever; wheezing; tightness in the chest or throat; trouble breathing, swallowing, or talking; unusual hoarseness; or swelling of the mouth, face, lips, tongue, or throat  ?? Signs of liver problems like dark urine, feeling tired, not hungry, upset stomach or stomach pain, light-colored stools, throwing up, or yellow skin or eyes.  ?? Signs of too much lactic acid in the blood (lactic acidosis) like fast breathing, fast heartbeat, a heartbeat that does not feel normal, very bad upset stomach or throwing up, feeling very sleepy, shortness of breath, feeling very tired or weak, very bad dizziness, feeling cold, or muscle pain or cramps  ?? Signs of a pancreas problem (pancreatitis) like very bad stomach pain, very bad back pain, or very bad upset stomach or throwing up  ?? A burning, numbness, or tingling feeling that is not normal  ?? Low mood (depression).    Monitoring Parameters    ?? Hepatic function  ?? Renal function  ?? Viral load  ?? CD4 count  ?? Test for HBV prior to therapy signs/symptoms of lactic acidosis; signs/symptoms of pancreatitis    Contraindications, Warnings, & Precautions     ?? Immune reconstitution syndrome: Patients may develop immune reconstitution syndrome resulting in the occurrence of an inflammatory response  to an indolent or residual opportunistic infection during initial HIV treatment or activation of autoimmune disorders  ?? Chronic hepatitis B: [US Boxed Warning]: Severe acute exacerbations of hepatitis B (some fatal) have been reported in patients with HBV or HIV/HBV coinfection who have discontinued lamivudine; hepatic function should be monitored closely with both clinical and laboratory follow-up for at least several months after discontinuation. Initiate antihepatitis B (HBV) medications if clinically appropriate.  ?? Pancreatitis  ?? Lactic acidosis    Drug/Food Interactions ??? Medication list reviewed in Epic. The patient was instructed to inform the care team before taking any new medications or supplements. No drug interactions identified.     Storage, Handling Precautions, & Disposal     ?? Store at room temperature in a dry place. Do not store in a bathroom.  ?? Keep lid tightly closed.   ?? Keep all drugs in a safe place. Keep all drugs out of the reach of children and pets.   ?? Throw away unused or expired drugs. Do not flush down a toilet or pour down a drain unless you are told to do so. Check with your pharmacist if you have questions about the best way to throw out drugs. There may be drug take-back programs in your area.    Tivicay (dolutegravir)    The patient declined counseling on medication administration, missed dose instructions, goals of therapy, side effects and monitoring parameters, warnings and precautions, drug/food interactions and storage, handling precautions, and disposal because currently taking Dovato that will be replaced with the same ingredients lamivudine and Tivicay at the same strengths. The information in the declined sections below are for informational purposes only and was not discussed with patient.       Medication & Administration     Dosage: Take one tablet (50mg ) by mouth once daily    Administration: Take with or without food    Adherence/Missed dose instructions: take missed dose as soon as you remember. If it is close to the time of your next dose, skip the dose and resume with your next scheduled dose.    Goals of Therapy     to keep HIV levels at a non-detectable level on lab tests.    Side Effects & Monitoring Parameters     Common Side Effects:   ??? Headache  ??? Feeling tired or weak  ??? Trouble sleeping      The following side effects should be reported to the provider:  ??? Signs of an allergic reaction, like rash; hives; itching; red, swollen, blistered, or peeling skin with or without fever; wheezing; tightness in the chest or throat; trouble breathing, swallowing, or talking; unusual hoarseness; or swelling of the mouth, face, lips, tongue, or throat  ??? Signs of liver problems like dark urine, feeling tired, not hungry, upset stomach or stomach pain, light-colored stools, throwing up, or yellow skin or eyes  ??? Fever  ??? Muscle or joint pain  ??? Mouth sores  ??? Eye irritation.   ??? Shortness of breath  ??? Feeling very tired or weak  ??? Signs of infection like fever, sore throat, weakness, cough, or shortness of breath.      Contraindications, Warnings, & Precautions   ??? Hepatotoxicity  ??? Hypersensitivity reactions: Rash, constitutional findings, and organ dysfunction (eg, liver injury) have been reported.  ??? Immune reconstitution syndrome: Patients may develop immune reconstitution syndrome resulting in the occurrence of an inflammatory response to an indolent or residual opportunistic infection during initial HIV treatment or  activation of autoimmune disorders (eg, Graves disease, polymyositis, Guillain-Barr?? syndrome) later in therapy  ??? Use caution in patients with renal or hepatic impairment    Drug/Food Interactions   ??? Medication list reviewed in Epic. The patient was instructed to inform the care team before taking any new medications or supplements. I told her that she must take the Tivicay and lamivudine at the same time as her prenatal vitamin with food or take the Tivicay and lamivudine 2 hours before or 6 hours after taking the prenatal vitamin.Marland Kitchen   ??? Tivicay should be taken 2 hours before or 6 hours after taking cation-containing antacids or laxatives, sucralfate, oral supplements containing iron or calcium, or buffered medications.  ??? Tivicay and supplements containing calcium or iron can be taken together with food.    Storage, Handling Precautions, & Disposal     For the 10 mg tablet: Store in the original container. Do not take out the antimoisture cube or packet.    For all products:   ??? Store at room temperature in a dry place. Do not store in a bathroom.  ??? Keep lid tightly closed.  ??? Keep all drugs in a safe place  ??? Keep all drugs out of the reach of children and pets.   ??? Throw away unused or expired drugs. Do not flush down a toilet or pour down a drain unless you are told to do so. Check with your pharmacist if you have questions about the best way to throw out drugs. There may be drug take-back programs in your area.          Current Medications (including OTC/herbals), Comorbidities and Allergies     Current Outpatient Medications   Medication Sig Dispense Refill   ??? dolutegravir (TIVICAY) 50 mg TABLET Take 1 tablet (50 mg total) by mouth daily. 30 tablet 5   ??? lamiVUDine (EPIVIR) 300 MG tablet Take 1 tablet (300 mg total) by mouth daily. 30 tablet 5   ??? metFORMIN (GLUCOPHAGE) 500 MG tablet Take 1 tablet (500 mg total) by mouth 2 (two) times a day with meals. 60 tablet 5   ??? PNV 102-iron-folate-dha 90 mg iron- 1 mg-200 mg cap Take 1 tablet by mouth daily. (Patient not taking: Reported on 06/20/2021) 90 capsule 4     No current facility-administered medications for this visit.       No Known Allergies    Patient Active Problem List   Diagnosis   ??? HIV disease (CMS-HCC)   ??? Routine general medical examination at a health care facility   ??? Elevated LFTs   ??? Menometrorrhagia   ??? Cervical dysplasia   ??? Morbid obesity (CMS-HCC)   ??? Abscess of flank   ??? Biological false positive RPR test       Reviewed and up to date in Epic.    Appropriateness of Therapy     Acute infections noted within Epic:  No active infections  Patient reported infection: None    Is medication and dose appropriate based on diagnosis and infection status? Yes    Prescription has been clinically reviewed: Yes      Baseline Quality of Life Assessment      How many days over the past month did your HIV  keep you from your normal activities? For example, brushing your teeth or getting up in the morning. 0    Financial Information     Medication Assistance provided: None Required: has temporary PAP    Anticipated copay  of $0.00 for lamivudine and $0.00 for Tivicay  reviewed with patient. Verified delivery address.    Delivery Information     Scheduled delivery date: 06/23/21    Expected start date: approximately 06/27/21 based on an estimate of 1 week left of Dovato at home    Medication will be delivered via UPS to the prescription address in [facility].  This shipment will not require a signature.      Explained the services we provide at [facility] and that each month we would call to set up refills.  Stressed importance of returning phone calls so that we could ensure they receive their medications in time each month.  Informed patient that we should be setting up refills 7-10 days prior to when they will run out of medication.  A pharmacist will reach out to perform a clinical assessment periodically.  Informed patient that a welcome packet, containing information about our pharmacy and other support services, a Notice of Privacy Practices, and a drug information handout will be sent.      The patient or caregiver noted above participated in the development of this care plan and knows that they can request review of or adjustments to the care plan at any time.      Patient or caregiver verbalized understanding of the above information as well as how to contact the pharmacy at [phone] option 4 with any questions/concerns.  The pharmacy is open Monday through Friday 8:30am-4:30pm.  A pharmacist is available 24/7 via pager to answer any clinical questions they may have.    Patient Specific Needs     - Does the patient have any physical, cognitive, or cultural barriers? No    - Does the patient have adequate living arrangements? (i.e. the ability to store and take their medication appropriately) Yes    - Did you identify any home environmental safety or security hazards? No    - Patient prefers to have medications discussed with  Patient     - Is the patient or caregiver able to read and understand education materials at a high school level or above? Yes    - Patient's primary language is  Spanish     - Is the patient high risk? No    - Does the patient require physician intervention or other additional services (i.e. dietary/nutrition, smoking cessation, social work)? No      Roderic Palau  [facility] Specialty Pharmacist

## 2021-06-20 NOTE — Unmapped (Signed)
Assessment/Plan:      Patient Active Problem List   Diagnosis   ??? HIV disease (CMS-HCC)   ??? Routine general medical examination at a health care facility   ??? Elevated LFTs   ??? Menometrorrhagia   ??? Cervical dysplasia   ??? Morbid obesity (CMS-HCC)   ??? Abscess of flank   ??? Biological false positive RPR test     HIV disease (CMS-HCC): Well-controlled with CD4 count 800 and undetectable viral load.   - Continue current regimen     Type 2 diabetes mellitus without complication, without long-term current use of insulin (CMS-HCC): A1c 8% Nov. 2020, improved ot mid-5s now. On Metformin 500 every day. Also working with ID dietician on weight loss.  -     A1c 6.0%, excellent control.   -  Continue metformin, no need to intensify treatment at this time.   -  Continue working with dietitian.  - Discussed that pills to help with appetite suppression and/or weight loss are usually not recommended due to adverse effects.    Elevated LFTs: I suspect that NAFLD is the most likely cause. Will continue to monitor and work up further if remain elevated.    SOB episodes: It is not clear the etiology of these. Given their association with stress now and in the past, I suspect it is related to anxiety-provoked dreams and with hopefully abate. She is quite young for this to be heart-related. OSA is a possibility given body habitus though she does not snore as far as she knows. EKG was normal last visit. Negative for Chagas disease. Will consider pulmonary referral if she continues to have these episodes.    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 Will continue to monitor.    Sexual Health: Monogamous with her husband. Last pap 2 years ago showed atypical glandular cells so was referred to Gynecology at last visit. She has since had pap (ASCUS). Additionally she and her husband are working with OB/GYN for primary infertility.    Mental Health: Mood is better today though seems slightly anxious. Will continue to monitor.     Health maintenance:  Up to date on vaccines.    Educational and counseling services took 15 minutes of today's 25 minute visit.    Follow-up:  Return to clinic in 6 months or sooner if needed.   Subjective:    Kimberly Nicholson is a 30 y.o. female who presents to the Infectious Disease clinic for return HIV visit.     Chief Complaint: No chief complaint on file.      HPI: Ms. Kimberly Nicholson comes in today for follow up. She has been doing well from the HIV perspective, with suppressed VL and normal CD4 count, no problems with medications. DM is under very good control. She was told that she did not qualify for HMAP because of income but she does not have health insurance.     She has been working with a nutritionist. Development worker, community is helpful. Feels really hungry, wants pill to help.   At last visit she was diagnosed with COVID, has since recovered. Tested negative on 7/19 at [facility].    Had numbness in anterior thighs yesterday - never happened before. Had been sitting a while when it happened. Felt a little weak when stood up but has now resolved. No back pain. Was wearing a dress, no tight-fitting clothing..     At last visit complained  of acute episodes of SOB and anxiety that awaken her from sleep, likely due to stress. EKG and Chagas serologies negative. She continues to have rare episodes of SOB, phlegm. Saturday woke up with trouble breathing, helped with breathing water boiled with mint. Has happened ~5 times in life. No other episodes of SOB in different situations. Sometimes upper back hurts when walks. Can walk indefinitely but this happens with longer walks. Exercises with walking fairly regularly.       Past Medical History:   Diagnosis Date   ??? HIV disease (CMS-HCC)        Medications:  Current Outpatient Medications   Medication Sig Dispense Refill   ??? dolutegravir-lamivudine (DOVATO) 50-300 mg Tab Take 1 tablet by mouth daily. 30 tablet 11   ??? metFORMIN (GLUCOPHAGE) 500 MG tablet TOME UNA PASTILLA DOS VECES AL DIA CON COMIDA(LA DOSIS ACTUAL ES DOS VECES AL DIA) 60 tablet 5   ??? PNV 102-iron-folate-dha 90 mg iron- 1 mg-200 mg cap Take 1 tablet by mouth daily. 90 capsule 4     No current facility-administered medications for this visit.       Allergies: Patient has no known allergies.    Social History:  General: Lives with husband. Born in [place], moved here in 2015. 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    Sexual History:  Married, monogamous    Substance Use:   no smoking, alcohol, no drugs    Reproductive:  no children, trying to conceive    Family history:  Grandmother - DM  Father - heart disease  Sister also has similar attacks at night    Review of Systems:  A 12 point review of systems was negative except for pertinent items noted in the HPI.      Objective:   BP 103/69 (BP Site: L Arm, BP Position: Sitting, BP Cuff Size: Large)  - Pulse 74  - Temp 36.9 ??C (98.4 ??F)  - Ht 156.3 cm (5' 1.54)  - Wt (!) 125.3 kg (276 lb 3.2 oz)  - LMP 05/21/2021 (Exact Date)  - BMI 51.28 kg/m??   GEN:  looks well, no apparent distress  EYES: sclerae anicteric and non injected and PERRL, EOMI  ZHY:QMVHQIO surgical mask, nares clear  LYMPH:no cervical or supraclavicular LAD  NG:EXBMWUXL  PULM:no clubbing or cyanosis and normal work of breathing  KGM:WNUU, NTND, no masses and no HSM  VO:ZDGUYQIH  RECTAL:deferred  SKIN:no petechiae, ecchymoses or obvious rashes on clothed exam  MSK:no joint tenderness and normal ROM throughout  NEURO:CN II-XII grossly intact, MAEE, non focal  PSYCH:attentive, appropriate affect, good eye contact, fluent speech    Recent Labs:    CD4% (T Helper)   Date Value Ref Range Status   11/22/2020 32 (L) 34 - 58 % Final   04/12/2020 31 (L) 34 - 58 % Final   09/29/2019 26 (L) 34 - 58 % Final   11/25/2018 29 (L) 34 - 58 % Final     HIV RNA   Date Value Ref Range Status   07/04/2017 <40 (H) <0 copies/mL Final   06/04/2017 20,079 (H) <0 copies/mL Final     HIV RNA Log(10)   Date Value Ref Range Status   07/04/2017  <0.00 log copies/mL Final     Comment:     <1.6 log   06/04/2017 4.30 (H) <0.00 log copies/mL Final      Lab Results   Component Value Date    WBC 8.0  11/22/2020    HGB 14.4 11/22/2020    Platelet 258 11/22/2020    Creatinine 0.69 11/22/2020    AST 46 (H) 11/22/2020    ALT 70 (H) 11/22/2020    Triglycerides 143 11/22/2020    HDL 32 (L) 11/22/2020    LDL Calculated 73 11/22/2020    LDL Direct 86.7 11/22/2020        Immunization History   Administered Date(s) Administered   ??? COVID-19 VACCINE,MRNA(MODERNA)(PF)(IM) 04/12/2020, 05/10/2020, 11/22/2020   ??? HEPB-CPG,ADULT DOSAGE ADJUVANTED, IM 06/24/2018, 11/25/2018   ??? Hepatitis B Vaccine, Dialysis 07/04/2017   ??? Influenza Vaccine Quad (IIV4 PF) 31mo+ injectable 02/18/2018, 11/25/2018, 09/29/2019, 11/22/2020   ??? PNEUMOCOCCAL POLYSACCHARIDE 23 06/24/2018   ??? Pneumococcal Conjugate 13-Valent 07/04/2017   ??? TdaP 09/29/2019

## 2021-06-22 LAB — HIV RNA, QUANTITATIVE, PCR: HIV RNA QNT RSLT: NOT DETECTED

## 2021-07-21 DIAGNOSIS — B20 Human immunodeficiency virus [HIV] disease: Principal | ICD-10-CM

## 2021-07-21 MED ORDER — DOVATO 50 MG-300 MG TABLET
ORAL_TABLET | Freq: Every day | ORAL | 5 refills | 0.00000 days | Status: CP
Start: 2021-07-21 — End: 2021-07-21

## 2021-07-21 NOTE — Unmapped (Signed)
Kimberly Nicholson 's Dovato shipment will be delayed as a result of unable to process through mfr assistance due to technical issue.    I have reached out to the patient  at [phone] and communicated the delay. We will call the patient back to reschedule the delivery upon resolution. We have not confirmed the new delivery date.

## 2021-07-21 NOTE — Unmapped (Addendum)
[facility]   Patient Onboarding/Medication Counseling    [name] is a 30 y.o. female with HIV who I am counseling today on continuation  of therapy. She is taking the equivalent of Dovato now in the form of Tivicay 50mg  and lamivudine 300mg  plus has some Dovato left over as well (5 days total of medication).   I am speaking to the patient.    Was a Nurse, learning disability used for this call? Yes, Spanish Conservation officer, historic buildings [phone]). Patient language is appropriate in [facility]    Verified patient's date of birth / HIPAA.    Specialty medication(s) to be sent: Infectious Disease: Dovato      Non-specialty medications/supplies to be sent: n/a      Medications not needed at this time: n/a         Dovato (dolutegravir-lamivudine 50-300mg ) tablets    The patient declined counseling on medication administration, missed dose instructions, goals of therapy, side effects and monitoring parameters, warnings and precautions, drug/food interactions and storage, handling precautions, and disposal because they have taken the medication previously. The information in the declined sections below are for informational purposes only and was not discussed with patient.       Medication & Administration     Dosage: Take one tablet by mouth once daily    Administration:   ??? Take with or without food  ??? 2 hours before or 6 hours after cation-containing antacids or laxatives, sucralfate, oral supplements containing iron or calcium, or buffered medications. Alternatively, dolutegravir/lamivudine and supplements containing calcium or iron can be taken together with food.    Adherence/Missed dose instructions: take missed dose as soon as you remember. If it is close to the time of your next dose, skip the dose and resume with your next scheduled dose.    Goals of Therapy     The goal of therapy is to prevent HIV replication and keep HIV at a non-detectable level on lab tests    Side Effects & Monitoring Parameters ??? Headache.  ??? Diarrhea.  ??? Upset stomach.  ??? Trouble sleeping.  ??? Feeling tired or weak    The following side effects should be reported to the provider:  ??? Signs of an allergic reaction, like rash; hives; itching; red, swollen, blistered, or peeling skin with or without fever; wheezing; tightness in the chest or throat; trouble breathing, swallowing, or talking; unusual hoarseness; or swelling of the mouth, face, lips, tongue, or throat.  ??? Signs of liver problems like dark urine, feeling tired, not hungry, upset stomach or stomach pain, light-colored stools, throwing up, or yellow skin or eyes.  ??? Signs of too much lactic acid in the blood (lactic acidosis) like fast breathing, fast heartbeat, a heartbeat that does not feel normal, very bad upset stomach or throwing up, feeling very sleepy, shortness of breath, feeling very tired or weak, very bad dizziness, feeling cold, or muscle pain or cramps.  ??? Signs of a pancreas problem (pancreatitis) like very bad stomach pain, very bad back pain, or very bad upset stomach or throwing up.  ??? Fever.  ??? Muscle or joint pain.  ??? Mouth sores.  ??? Eye irritation.  ??? Shortness of breath.  ??? Changes in your immune system can happen when you start taking drugs to treat HIV. If you have an infection that you did not know you had, it may show up when you take this drug. Tell your doctor right away if you have any new signs after you  start this drug, even after taking it for several months. This includes signs of infection like fever, sore throat, weakness, cough, or shortness of breath.    Contraindications, Warnings, & Precautions     ??? Lactic acidosis  ??? Immune reconstitution syndrome  ??? Hypersensitivity reactions  ??? Renal Impairment: not recommended in patients with CrCl <50 ml/min  ??? Chronic hepatitis B: [US Boxed Warning]: Severe acute exacerbations of hepatitis B virus (HBV) have been reported in patients who are co-infected with HIV-1 and HBV and have discontinued lamivudine. Closely monitor hepatic function in these patients and, if appropriate, initiate anti-HBV treatment  ??? Hepatic impairment: Not recommended for use in patients with severe hepatic impairment (Child-Pugh class C)  ??? HBV resistance [US Boxed Warning]: All patients with HIV-1 should be tested for the presence of HBV prior to or when initiating dolutegravir/lamivudine. Emergence of lamivudine-resistant HBV variants in patients receiving lamivudine-containing antiretroviral regimens has been reported. If dolutegravir/lamivudine is used in patients co-infected with HIV-1 and HBV, additional treatment should be considered for appropriate treatment of chronic HBV; otherwise, consider an alternative regimen.   ??? Concurrent use with dofetelide    Drug/Food Interactions     ??? Medication list reviewed in Epic. The patient was instructed to inform the care team before taking any new medications or supplements. Drug interaction as follows.   o Prenatal vitamins: I told her to either take at the same time as Dovato with food or to take Dovato 2 hours before or 6 hours after. She is going to take at the same time with food.      Storage, Handling Precautions, & Disposal     ??? Store at room temperature in a dry place. Do not store in a bathroom.  ??? Keep all drugs in a safe place. Keep all drugs out of the reach of children and pets.  ??? Throw away unused or expired drugs. Do not flush down a toilet or pour down a drain unless you are told to do so. Check with your pharmacist if you have questions about the best way to throw out drugs. There may be drug take-back programs in your area      Current Medications (including OTC/herbals), Comorbidities and Allergies     Current Outpatient Medications   Medication Sig Dispense Refill   ??? dolutegravir-lamivudine (DOVATO) 50-300 mg Tab Take 1 tablet by mouth daily. 30 tablet 5   ??? metFORMIN (GLUCOPHAGE) 500 MG tablet Take 1 tablet (500 mg total) by mouth 2 (two) times a day with meals. 60 tablet 5   ??? PNV 102-iron-folate-dha 90 mg iron- 1 mg-200 mg cap Take 1 tablet by mouth daily. 90 capsule 4     No current facility-administered medications for this visit.       No Known Allergies    Patient Active Problem List   Diagnosis   ??? HIV disease (CMS-HCC)   ??? Routine general medical examination at a health care facility   ??? Elevated LFTs   ??? Menometrorrhagia   ??? Cervical dysplasia   ??? Morbid obesity (CMS-HCC)   ??? Abscess of flank   ??? Biological false positive RPR test       Reviewed and up to date in Epic.    Appropriateness of Therapy     Acute infections noted within Epic:  No active infections  Patient reported infection: None    Is medication and dose appropriate based on diagnosis and infection status? Yes    Prescription has been  clinically reviewed: Yes      Baseline Quality of Life Assessment      How many days over the past month did your HIV  keep you from your normal activities? For example, brushing your teeth or getting up in the morning. 0    Financial Information     Medication Assistance provided: Manufacturer Assistance    Anticipated copay of $0.00 reviewed with patient. Verified delivery address.    Delivery Information     Scheduled delivery date: 07/21/21    Expected start date: continuation of existing medication    Medication will be delivered via UPS to the prescription address in [facility].  This shipment will not require a signature.      Explained the services we provide at [facility] and that each month we would call to set up refills.  Stressed importance of returning phone calls so that we could ensure they receive their medications in time each month.  Informed patient that we should be setting up refills 7-10 days prior to when they will run out of medication.  A pharmacist will reach out to perform a clinical assessment periodically.  Informed patient that a welcome packet, containing information about our pharmacy and other support services, a Notice of Privacy Practices, and a drug information handout will be sent.      The patient or caregiver noted above participated in the development of this care plan and knows that they can request review of or adjustments to the care plan at any time.      Patient or caregiver verbalized understanding of the above information as well as how to contact the pharmacy at [phone] option 4 with any questions/concerns.  The pharmacy is open Monday through Friday 8:30am-4:30pm.  A pharmacist is available 24/7 via pager to answer any clinical questions they may have.    Patient Specific Needs     - Does the patient have any physical, cognitive, or cultural barriers? No    - Does the patient have adequate living arrangements? (i.e. the ability to store and take their medication appropriately) Yes    - Did you identify any home environmental safety or security hazards? No    - Patient prefers to have medications discussed with  Patient     - Is the patient or caregiver able to read and understand education materials at a high school level or above? Yes    - Patient's primary language is  Spanish     - Is the patient high risk? No    - Does the patient require physician intervention or other additional services (i.e. dietary/nutrition, smoking cessation, social work)? No      Roderic Palau  [facility] Specialty Pharmacist

## 2021-07-21 NOTE — Unmapped (Signed)
[employer] Assistance  Pharmacy Billing Information:    BIN: [id number]  PCN: [id number]  Group: [id number]  ID: [id number]  Expiration Date: 10/19/21    Bradly Bienenstock LCSW, CHES  [facility] Lead Social Worker

## 2021-07-25 DIAGNOSIS — B20 Human immunodeficiency virus [HIV] disease: Principal | ICD-10-CM

## 2021-07-25 MED ORDER — DOVATO 50 MG-300 MG TABLET
ORAL_TABLET | Freq: Every day | ORAL | 5 refills | 0.00000 days | Status: CP
Start: 2021-07-25 — End: ?
  Filled 2021-07-28: qty 30, 30d supply, fill #0

## 2021-07-26 NOTE — Unmapped (Signed)
Update:    We are still having trouble with the mfg assistance company receiving the correct transmission on the Dovato 30 day card.  Dr. Tana Felts sent a Rx to [facility] in [place] and I gave [facility] the BIN/PCN/ID/GROUP and they are getting a similar reject saying their pharmacy is not contracted.  The billing department at this [facility] is calling the pharmacy help desk to find out what the problem is and will call us back.  I am speaking to Ms. Kimberly Nicholson via a Research officer, trade union Conservation officer, historic buildings [phone]) to give her an update.  I have notified [facility] that [facility] is getting the same rejection that we are getting. As soon as I have more information, I will call Ms Nicholson.    Corliss Skains. [place], [place].D.  Specialty Pharmacist  [facility]  [phone] option 4 then option 2

## 2021-07-27 DIAGNOSIS — B20 Human immunodeficiency virus [HIV] disease: Principal | ICD-10-CM

## 2021-07-27 MED ORDER — LAMIVUDINE 150 MG TABLET
ORAL_TABLET | Freq: Every day | ORAL | 5 refills | 30.00000 days | Status: CP
Start: 2021-07-27 — End: ?
  Filled 2021-07-27: qty 60, 30d supply, fill #0

## 2021-07-27 MED ORDER — DOLUTEGRAVIR 50 MG TABLET
ORAL_TABLET | Freq: Every day | ORAL | 5 refills | 30 days | Status: CP
Start: 2021-07-27 — End: ?
  Filled 2021-07-27: qty 30, 30d supply, fill #0

## 2021-07-27 NOTE — Unmapped (Signed)
[facility] Clinical Assessment & Refill Coordination Note    Kimberly Nicholson, DOB: 11/20/1990  Phone: [phone] (home)     All above HIPAA information was verified with patient.     Was a Nurse, learning disability used for this call? Yes, SpanishArt therapist [phone]. Patient language is appropriate in [facility]    Specialty Medication(s):   Infectious Disease: lamivudine and Tivicay     Current Outpatient Medications   Medication Sig Dispense Refill   ??? dolutegravir (TIVICAY) 50 mg TABLET Take 1 tablet (50 mg total) by mouth daily. 30 tablet 5   ??? dolutegravir-lamivudine (DOVATO) 50-300 mg Tab Take 1 tablet by mouth daily. 30 tablet 5   ??? dolutegravir-lamivudine (DOVATO) 50-300 mg Tab Take 1 tablet by mouth daily. 30 tablet 5   ??? lamiVUDine (EPIVIR) 150 MG tablet Take 2 tablets (300 mg total) by mouth daily. 60 tablet 5   ??? metFORMIN (GLUCOPHAGE) 500 MG tablet Take 1 tablet (500 mg total) by mouth 2 (two) times a day with meals. 60 tablet 5   ??? PNV 102-iron-folate-dha 90 mg iron- 1 mg-200 mg cap Take 1 tablet by mouth daily. 90 capsule 4     No current facility-administered medications for this visit.        Changes to medications: Zyanna reports no changes at this time.    No Known Allergies    Changes to allergies: No    SPECIALTY MEDICATION ADHERENCE     lamivudine 300 mg: 0 days of medicine on hand   Tivicay 50 mg: 0 days of medicine on hand   Medication Adherence    Patient reported X missed doses in the last month: 0  Specialty Medication: lamivudine 300mg   Patient is on additional specialty medications: Yes  Additional Specialty Medications: Tivicay 50mg   Patient Reported Additional Medication X Missed Doses in the Last Month: 0  Demonstrates understanding of importance of adherence: yes  Informant: patient  Provider-estimated medication adherence level: good  Patient is at risk for Non-Adherence: No       Specialty medication(s) dose(s) confirmed: Changing back from Dovato to lamivudine and dolutegravir.  I made sure patient know to take two 150mg  lamivudine tablets plus Tivicay 50mg  to equal Dovato      Are there any concerns with adherence? No    Adherence counseling provided? Not needed    CLINICAL MANAGEMENT AND INTERVENTION      Clinical Benefit Assessment:    Do you feel the medicine is effective or helping your condition? Yes    Clinical Benefit counseling provided? Not needed    Adverse Effects Assessment:    Are you experiencing any side effects? No    Are you experiencing difficulty administering your medicine? No    Quality of Life Assessment:      How many days over the past month did your HIV  keep you from your normal activities? For example, brushing your teeth or getting up in the morning. 0    Have you discussed this with your provider? Not needed    Acute Infection Status:    Acute infections noted within Epic:  No active infections  Patient reported infection: None    Therapy Appropriateness:    Is therapy appropriate? Yes, therapy is appropriate and should be continued    DISEASE/MEDICATION-SPECIFIC INFORMATION      N/A    PATIENT SPECIFIC NEEDS     - Does the patient have any physical, cognitive, or cultural barriers? No    -  Is the patient high risk? No    - Does the patient require a Care Management Plan? No     - Does the patient require physician intervention or other additional services (i.e. nutrition, smoking cessation, social work)? No      SHIPPING     Specialty Medication(s) to be Shipped:   Infectious Disease: lamivudine and Tivicay    Other medication(s) to be shipped: No additional medications requested for fill at this time     Changes to insurance: No    Delivery Scheduled: Yes, Expected medication delivery date: 07/27/21.     Medication will be delivered via [employer] : Same Day Courier to the confirmed prescription address in [facility].    The patient will receive a drug information handout for each medication shipped and additional FDA Medication Guides as required.  Verified that patient has previously received a Conservation officer, historic buildings and a Surveyor, mining.    The patient or caregiver noted above participated in the development of this care plan and knows that they can request review of or adjustments to the care plan at any time.      All of the patient's questions and concerns have been addressed.    Roderic Palau   [facility] Specialty Pharmacist

## 2021-07-28 ENCOUNTER — Emergency Department
Admission: EM | Admit: 2021-07-28 | Discharge: 2021-07-28 | Disposition: A | Discharge status: Home / Self Care | Payer: Self-pay | Attending: Emergency Medicine | Admitting: Emergency Medicine

## 2021-07-28 ENCOUNTER — Emergency Department: Payer: Self-pay

## 2021-07-28 ENCOUNTER — Other Ambulatory Visit: Payer: Self-pay

## 2021-07-28 DIAGNOSIS — R002 Palpitations: Secondary | ICD-10-CM | POA: Insufficient documentation

## 2021-07-28 DIAGNOSIS — R0789 Other chest pain: Secondary | ICD-10-CM | POA: Insufficient documentation

## 2021-07-28 DIAGNOSIS — R079 Chest pain, unspecified: Secondary | ICD-10-CM

## 2021-07-28 DIAGNOSIS — R197 Diarrhea, unspecified: Secondary | ICD-10-CM | POA: Insufficient documentation

## 2021-07-28 DIAGNOSIS — R0602 Shortness of breath: Secondary | ICD-10-CM | POA: Insufficient documentation

## 2021-07-28 DIAGNOSIS — E119 Type 2 diabetes mellitus without complications: Secondary | ICD-10-CM | POA: Insufficient documentation

## 2021-07-28 DIAGNOSIS — Z21 Asymptomatic human immunodeficiency virus [HIV] infection status: Secondary | ICD-10-CM | POA: Insufficient documentation

## 2021-07-28 DIAGNOSIS — R000 Tachycardia, unspecified: Secondary | ICD-10-CM | POA: Insufficient documentation

## 2021-07-28 LAB — CBC
HCT: 45.9 % (ref 36.0–46.0)
Hemoglobin: 14.9 g/dL (ref 12.0–15.0)
MCH: 26 pg (ref 26.0–34.0)
MCHC: 32.5 g/dL (ref 30.0–36.0)
MCV: 80.2 fL (ref 80.0–100.0)
Platelets: 245 10*3/uL (ref 150–400)
RBC: 5.72 MIL/uL — ABNORMAL HIGH (ref 3.87–5.11)
RDW: 13.9 % (ref 11.5–15.5)
WBC: 8.7 10*3/uL (ref 4.0–10.5)
nRBC: 0 % (ref 0.0–0.2)

## 2021-07-28 LAB — BASIC METABOLIC PANEL
Anion gap: 9 (ref 5–15)
BUN: 12 mg/dL (ref 6–20)
CO2: 25 mmol/L (ref 22–32)
Calcium: 9.2 mg/dL (ref 8.9–10.3)
Chloride: 101 mmol/L (ref 98–111)
Creatinine, Ser: 0.87 mg/dL (ref 0.44–1.00)
GFR, Estimated: >60 mL/min (ref >60)
Glucose, Bld: 141 mg/dL — ABNORMAL HIGH (ref 70–99)
Potassium: 3.6 mmol/L (ref 3.5–5.1)
Sodium: 135 mmol/L (ref 135–145)

## 2021-07-28 LAB — POC URINE PREG, ED: Preg Test, Ur: NEGATIVE

## 2021-07-28 LAB — TSH: TSH: 4.925 u[IU]/mL — ABNORMAL HIGH (ref 0.350–4.500)

## 2021-07-28 LAB — TROPONIN I (HIGH SENSITIVITY): Troponin I (High Sensitivity): 4 ng/L (ref <18)

## 2021-07-28 LAB — HCG, QUANTITATIVE, PREGNANCY: hCG, Beta Chain, Quant, S: <1 m[IU]/mL (ref <5)

## 2021-07-28 LAB — D-DIMER, QUANTITATIVE: D-Dimer, Quant: 0.54 ug/mL-FEU — ABNORMAL HIGH (ref 0.00–0.50)

## 2021-07-28 LAB — MAGNESIUM: Magnesium: 2 mg/dL (ref 1.7–2.4)

## 2021-07-28 MED ORDER — ALUM & MAG HYDROXIDE-SIMETH 200-200-20 MG/5ML PO SUSP: 30.0000 mL | Freq: Once | ORAL | Status: DC

## 2021-07-28 MED ORDER — SUCRALFATE 1 G PO TABS: 1.0000 g | ORAL_TABLET | Freq: Once | ORAL | Status: DC

## 2021-07-28 MED ORDER — PANTOPRAZOLE SODIUM 40 MG PO TBEC: 40.0000 mg | DELAYED_RELEASE_TABLET | Freq: Once | ORAL | Status: DC

## 2021-07-28 MED ORDER — LACTATED RINGERS IV BOLUS
1000.0000 mL | Freq: Once | INTRAVENOUS | Status: AC
  Administered 2021-07-28: 1000 mL via INTRAVENOUS

## 2021-07-28 NOTE — ED Triage Notes (Signed)
Pt comes with c/o SOB and heart palpitations. Pt states this just started about 20 minutes ago while at work.

## 2021-07-28 NOTE — ED Provider Notes (Addendum)
[facility] Emergency Department Provider Note  ____________________________________________   Event Date/Time   First MD Initiated Contact with Patient 07/28/21 1427     (approximate)  I have reviewed the triage vital signs and the nursing notes.   HISTORY  Chief Complaint Shortness of Breath   HPI Jamie Heath is a 30 y.o. female with a past medical history of HIV and DM who presents for assessment of some palpitations chest pressure and shortness of breath that she developed approximately 20 minutes prior to arrival.  Patient states she was at work sitting with this began.  States she is worried it may be from taking all of her medicines this morning on empty stomach with a black cup of coffee.  She states she does not normally drink coffee or have any caffeine.  She denies any headache, earache, sore throat, nausea or vomiting but does state she had a little diarrhea as well after symptoms started.  She has no abdominal pain, back pain, urinary symptoms, rash or recent injuries or falls.  No other acute concerns at this time.  She denies any illicit drug use, tobacco abuse or EtOH use.         Past Medical History:  Diagnosis Date   Breast discharge    HIV (human immunodeficiency virus infection) (HCC)     Patient Active Problem List   Diagnosis Date Noted   Morbid obesity (HCC) 10/21/2015   Abscess of flank 09/30/2015    Past Surgical History:  Procedure Laterality Date   IRRIGATION AND DEBRIDEMENT ABSCESS Right 09/30/2015   Procedure: IRRIGATION AND DEBRIDEMENT FLANK ABSCESS;  Surgeon: Rodman Pickle, MD;  Location: [facility] OR;  Service: General;  Laterality: Right;    Prior to Admission medications   Medication Sig Start Date End Date Taking? Authorizing Provider  BIKTARVY 50-200-25 MG TABS tablet TK 1 T PO D 11/27/17   [provider]  naproxen (NAPROSYN) 500 MG tablet Take 1 tablet (500 mg total) by mouth 2 (two) times  daily as needed for mild pain or moderate pain. 11/02/17   Ward, Chase Picket, PA-C    Allergies Patient has no known allergies.  Family History  Problem Relation Age of Onset   Diabetes Maternal Grandmother    Cancer Maternal Grandfather     Social History Social History   Tobacco Use   Smoking status: Never  Substance Use Topics   Alcohol use: No   Drug use: No    Review of Systems  Review of Systems  Constitutional:  Negative for chills and fever.  HENT:  Negative for sore throat.   Eyes:  Negative for pain.  Respiratory:  Positive for shortness of breath. Negative for cough and stridor.   Cardiovascular:  Positive for chest pain and palpitations.  Gastrointestinal:  Positive for diarrhea. Negative for vomiting.  Genitourinary:  Negative for dysuria.  Musculoskeletal:  Negative for myalgias.  Skin:  Negative for rash.  Neurological:  Negative for seizures, loss of consciousness and headaches.  Psychiatric/Behavioral:  Negative for suicidal ideas.   All other systems reviewed and are negative.    ____________________________________________   PHYSICAL EXAM:  VITAL SIGNS: ED Triage Vitals  Enc Vitals Group     BP 07/28/21 1419 121/82     Pulse Rate 07/28/21 1419 (!) 126     Resp 07/28/21 1419 18     Temp 07/28/21 1419 98.3 F (36.8 C)     Temp src --  SpO2 07/28/21 1419 98 %     Weight --      Height --      Head Circumference --      Peak Flow --      Pain Score 07/28/21 1416 0     Pain Loc --      Pain Edu? --      Excl. in GC? --    Vitals:   07/28/21 1419 07/28/21 1500  BP: 121/82 112/70  Pulse: (!) 126 (!) 109  Resp: 18 17  Temp: 98.3 F (36.8 C)   SpO2: 98% 99%   Physical Exam Vitals and nursing note reviewed.  Constitutional:      General: She is not in acute distress.    Appearance: She is well-developed.  HENT:     Head: Normocephalic and atraumatic.     Right Ear: External ear normal.     Left Ear: External ear normal.      Nose: Nose normal.  Eyes:     Conjunctiva/sclera: Conjunctivae normal.  Cardiovascular:     Rate and Rhythm: Regular rhythm. Tachycardia present.     Heart sounds: No murmur heard. Pulmonary:     Effort: Pulmonary effort is normal. No respiratory distress.     Breath sounds: Normal breath sounds.  Abdominal:     Palpations: Abdomen is soft.     Tenderness: There is no abdominal tenderness.  Musculoskeletal:     Cervical back: Neck supple.  Skin:    General: Skin is warm and dry.     Capillary Refill: Capillary refill takes less than 2 seconds.  Neurological:     Mental Status: She is alert and oriented to person, place, and time.  Psychiatric:        Mood and Affect: Mood normal.     ____________________________________________   LABS (all labs ordered are listed, but only abnormal results are displayed)  Labs Reviewed  BASIC METABOLIC PANEL - Abnormal; Notable for the following components:      Result Value   Glucose, Bld 141 (*)    All other components within normal limits  CBC - Abnormal; Notable for the following components:   RBC 5.72 (*)    All other components within normal limits  D-DIMER, QUANTITATIVE - Abnormal; Notable for the following components:   D-Dimer, Quant 0.54 (*)    All other components within normal limits  TSH - Abnormal; Notable for the following components:   TSH 4.925 (*)    All other components within normal limits  HCG, QUANTITATIVE, PREGNANCY  MAGNESIUM  POC URINE PREG, ED  TROPONIN I (HIGH SENSITIVITY)  TROPONIN I (HIGH SENSITIVITY)   ____________________________________________  EKG  Sinus tachycardia with ventricular rate of 110, normal axis, unremarkable intervals without clearance of acute ischemia or significant arrhythmia. ____________________________________________  RADIOLOGY  ED MD interpretation: Chest x-ray has no focal consolidations, effusion, edema, pneumothorax or other clear acute thoracic process.  Official  radiology report(s): DG Chest 2 View  Result Date: 07/28/2021 CLINICAL DATA:  Short of breath, palpitations, chest pressure EXAM: CHEST - 2 VIEW COMPARISON:  None. FINDINGS: The heart size and mediastinal contours are within normal limits. Both lungs are clear. The visualized skeletal structures are unremarkable. IMPRESSION: No active cardiopulmonary disease. Electronically Signed   By: Sharlet Salina M.D.   On: 07/28/2021 15:09    ____________________________________________   PROCEDURES  Procedure(s) performed (including Critical Care):  .1-3 Lead EKG Interpretation Performed by: Gilles Chiquito, MD Authorized by: Antoine Primas  P, MD     Interpretation: non-specific     ECG rate assessment: tachycardic     Rhythm: sinus tachycardia     Ectopy: none     Conduction: normal     ____________________________________________   INITIAL IMPRESSION / ASSESSMENT AND PLAN / ED COURSE      Patient presents with above to history exam for assessment of fairly sudden onset of chest pain associate with shortness of breath, palpitations and diarrhea all the setting of taking a couple of black coffee for the first time with all of her medicines on empty stomach.  On arrival she is tachycardic with otherwise stable vital signs on room air.  Her lungs are clear bilaterally and her abdomen is soft.  She does not appear significantly dehydrated on exam.  Differential includes dehydration, infection of recent caffeine ingestion, arrhythmia, ACS, PE, pneumonia, bronchitis, dehydration and metabolic derangements.  Chest x-ray has no evidence of pneumonia heart failure or other acute thoracic process.  ECG shows sinus tachycardia without evidence of other arrhythmia or ischemic changes.  CBC shows no leukocytosis or acute anemia.  Urine pregnancy test is negative.  D-dimer 0.54.  Overall I have low suspicion for PE at this time per years criteria.  TSH is slightly elevated.  However do not believe  patient is currently thyrotoxic and I think she can follows up with her PCP.  I did discuss this with patient.  On reassessment her heart rate has decreased to 107 after some IV fluids and she states she has complete resolution of her chest pain and palpitations.  In addition she has never been hypoxic or tachypneic and denies any shortness of breath.  I suspect she may have had a little bit of gastritis or effects on her caffeine use vs infectious gastroenteritis.  However given she is not having any pain now with near complete resolution of her tachycardia I think she is stable for discharge with close outpatient follow-up.  Advised avoiding caffeine and only taking her medicines with food as she typically does.  She is amenable this plan.  Discharged stable condition.  Strict return precautions advised and discussed.   ____________________________________________   FINAL CLINICAL IMPRESSION(S) / ED DIAGNOSES  Final diagnoses:  Chest pain, unspecified type  Palpitations  Diarrhea, unspecified type    Medications  alum & mag hydroxide-simeth (MAALOX/MYLANTA) 200-200-20 MG/5ML suspension 30 mL (has no administration in time range)  pantoprazole (PROTONIX) EC tablet 40 mg (has no administration in time range)  sucralfate (CARAFATE) tablet 1 g (has no administration in time range)  lactated ringers bolus 1,000 mL (1,000 mLs Intravenous New Bag/Given 07/28/21 1457)     ED Discharge Orders     None        Note:  This document was prepared using Dragon voice recognition software and may include unintentional dictation errors.    Gilles Chiquito, MD 07/28/21 1557    Gilles Chiquito, MD 07/28/21 1601    Gilles Chiquito, MD 07/28/21 [phone]

## 2021-08-08 DIAGNOSIS — B20 Human immunodeficiency virus [HIV] disease: Principal | ICD-10-CM

## 2021-09-08 ENCOUNTER — Ambulatory Visit: Admit: 2021-09-08 | Discharge: 2021-09-09

## 2021-09-08 DIAGNOSIS — R0789 Other chest pain: Principal | ICD-10-CM

## 2021-09-08 LAB — CBC
HEMATOCRIT: 43.7 % (ref 34.0–44.0)
HEMOGLOBIN: 14.4 g/dL (ref 11.3–14.9)
MEAN CORPUSCULAR HEMOGLOBIN CONC: 32.9 g/dL (ref 32.0–36.0)
MEAN CORPUSCULAR HEMOGLOBIN: 26.3 pg (ref 25.9–32.4)
MEAN CORPUSCULAR VOLUME: 79.9 fL (ref 77.6–95.7)
MEAN PLATELET VOLUME: 8.7 fL (ref 6.8–10.7)
PLATELET COUNT: 154 10*9/L (ref 150–450)
RED BLOOD CELL COUNT: 5.47 10*12/L — ABNORMAL HIGH (ref 3.95–5.13)
RED CELL DISTRIBUTION WIDTH: 14.8 % (ref 12.2–15.2)
WBC ADJUSTED: 4 10*9/L (ref 3.6–11.2)

## 2021-09-08 LAB — COMPREHENSIVE METABOLIC PANEL
ALBUMIN: 4 g/dL (ref 3.4–5.0)
ALKALINE PHOSPHATASE: 66 U/L (ref 46–116)
ALT (SGPT): 176 U/L — ABNORMAL HIGH (ref 10–49)
ANION GAP: 11 mmol/L (ref 5–14)
AST (SGOT): 111 U/L — ABNORMAL HIGH (ref <=34)
BILIRUBIN TOTAL: 0.3 mg/dL (ref 0.3–1.2)
BLOOD UREA NITROGEN: 9 mg/dL (ref 9–23)
BUN / CREAT RATIO: 12
CALCIUM: 9.5 mg/dL (ref 8.7–10.4)
CHLORIDE: 104 mmol/L (ref 98–107)
CO2: 22.9 mmol/L (ref 20.0–31.0)
CREATININE: 0.77 mg/dL
EGFR CKD-EPI (2021) FEMALE: >90 mL/min/{1.73_m2} (ref >=60)
GLUCOSE RANDOM: 127 mg/dL (ref 70–179)
POTASSIUM: 4 mmol/L (ref 3.4–4.8)
PROTEIN TOTAL: 7.7 g/dL (ref 5.7–8.2)
SODIUM: 138 mmol/L (ref 135–145)

## 2021-09-08 LAB — T4, FREE: FREE T4: 1.11 ng/dL (ref 0.89–1.76)

## 2021-09-08 LAB — TSH: THYROID STIMULATING HORMONE: 2.096 u[IU]/mL (ref 0.550–4.780)

## 2021-09-08 NOTE — Unmapped (Unsigned)
Assessment/Plan:      Kimberly Nicholson, a 30 y.o. female seen today for urgent evaluation of ***    Plan:    ***  ?? ***    HIV  {Blank single:19197::Engaged in care with ***,Out of care, needs new provider}  Fills ART via {Blank single:19197::HMAP, Juanell Fairly approved for current period,HMAP, eligibility due for renewal - to meet with benefits counselor today,private insurance,Medicaid,Medicare,study meds,pharmacy assistance program}.   Lab Results   Component Value Date    ACD4 928 06/20/2021    HIVCP <40 (H) 07/04/2017    HIVRS Not Detected 06/20/2021     ?? {Blank single:19197::Continue current therapy,Switching ***}.  ?? {Blank single:19197::No labs today,Labs drawn through study,Checking CD4, HIV RNA, & safety labs (full return),Checking HIV RNA & safety labs (brief return)}.  ?? {Blank single:19197::Encouraged continued excellent,Discussed specific strategies to improve} ARV adherence.    Sexual health & secondary prevention    Lab Results   Component Value Date    RPR Reactive (A) 11/22/2020    RPR 1:4 11/22/2020    CTNAA Negative 02/24/2021    CTNAA Negative 11/25/2018    GCNAA Negative 02/24/2021    GCNAA Negative 11/25/2018    SPECTYPE Swab 02/24/2021    SPECTYPE Swab 11/25/2018    SPECSOURCE Cervix 02/24/2021    SPECSOURCE Cervix 11/25/2018     ?? GC/CT NAATs -- {Blank single:19197::obtained today,not being checked routinely for this patient,needed but deferred to future visit}  ?? RPR -- {Blank single:19197::NR *** - repeat 1Y,for screening obtained today,for treatment follow-up obtained today,not being checked routinely for this patient,needed but deferred to future visit}    Immunization History   Administered Date(s) Administered   ??? COVID-19 VACCINE,MRNA(MODERNA)(PF)(IM) 04/12/2020, 05/10/2020, 11/22/2020   ??? HEPB-CPG,ADULT DOSAGE ADJUVANTED, IM 06/24/2018, 11/25/2018   ??? Hepatitis B Vaccine, Dialysis 07/04/2017   ??? Influenza Vaccine Quad (IIV4 PF) 9mo+ injectable 02/18/2018, 11/25/2018, 09/29/2019, 11/22/2020   ??? PNEUMOCOCCAL POLYSACCHARIDE 23 06/24/2018   ??? Pneumococcal Conjugate 13-Valent 07/04/2017   ??? TdaP 09/29/2019     ?? Immunizations ordered today: {CBH 2017 Imm Order List:49937::***}    Counseling services took {Blank single:19197::5 minutes,10 minutes,15 minutes,20 minutes,more than 50%} of today's visit time.  Counseled today regarding ***.    Disposition  Return to clinic {Blank single:19197::1-2 weeks,4-6 weeks,8 weeks,3-4 months,5-6 months} or sooner if needed.    Lahoma Rocker  [facility]   [address] 1st floor   [place], [place]. [address]   Phone: [phone]   Fax: [phone]          Subjective:      Chief Complaint   HIV followup    HPI  Urgent visit for Kimberly Nicholson, a 30 y.o. female presenting for ***    Past Medical History:   Diagnosis Date   ??? HIV disease (CMS-HCC)        Medications and Allergies   Reviewed and updated today. See bottom of this visit's encounter summary for details.  Current Outpatient Medications on File Prior to Visit   Medication Sig   ??? dolutegravir (TIVICAY) 50 mg TABLET Take 1 tablet (50 mg total) by mouth daily.   ??? dolutegravir-lamivudine (DOVATO) 50-300 mg Tab Take 1 tablet by mouth daily.   ??? dolutegravir-lamivudine (DOVATO) 50-300 mg Tab Take 1 tablet by mouth daily.   ??? lamiVUDine (EPIVIR) 150 MG tablet Take 2 tablets (300 mg total) by mouth daily.   ??? metFORMIN (GLUCOPHAGE) 500 MG tablet Take 1 tablet (500 mg  total) by mouth 2 (two) times a day with meals.   ??? PNV 102-iron-folate-dha 90 mg iron- 1 mg-200 mg cap Take 1 tablet by mouth daily.     No current facility-administered medications on file prior to visit.       No Known Allergies    Social History  Social History     Tobacco Use   ??? Smoking status: Never Smoker   ??? Smokeless tobacco: Never Used   Substance Use Topics   ??? Alcohol use: No       Review of Systems  As per HPI. Remainder of 10 systems reviewed, negative.        Objective:      There were no vitals taken for this visit.    Const {CBH Blank Multiple:32997::mildly ill-appearing,looks well,attentive, alert, appropriate}   Eyes sclerae anicteric, noninjected OU   ENT {INF PE ZOX:[id number]}   Lymph {INF PE [phone]}   CV RRR. {Blank single:19197::SEM @2RICS .,SEM @ LLSB.,No murmurs.} No rub or gallop. S1/S2.   Resp {INF PE PULM:[phone]}   GI Soft. NTND. NABS.   GU {INF PE JX:9147829562}   Rectal {INF PE [phone]}   Skin {INF PE ZHYQ:6578469629}   MSK {INF PE BMW:4132440102}   Neuro {INF PE [phone]}   Psych {Blank single:19197::Flat,Appropriate} affect. Eye contact good. Linear thoughts. Fluent speech.     Laboratory Data  Reviewed in Epic today, using Synopsis and Chart Review filters.    Lab Results   Component Value Date    CREATININE 0.73 06/20/2021    QFTTBGOLD Negative 06/04/2017    HEPCAB Nonreactive 06/04/2017    CHOL 134 11/22/2020    HDL 32 (L) 11/22/2020    LDL 86.7 11/22/2020    LDL 73 11/22/2020    NONHDL 102 11/22/2020    TRIG 143 11/22/2020    A1C 6.0 (H) 06/20/2021    FINALDX  04/08/2019     A.  Endocervix, curettage:  -Fragments of endocervix and mucin.  -No dysplasia or malignancy identified.    B.  Endometrium, biopsy:  -Secretory endometrium.  -No hyperplasia or malignancy identified.                  _____________________________________________________________________

## 2021-09-08 NOTE — Unmapped (Addendum)
Specialty Medication(s): lamivudine and Tivicay or Dovato    Ms.Reyes-Garcia has been dis-enrolled from the [facility] specialty pharmacy services due to enrollment in a manufacturer assistance program that sends medicine directly to the patient.    Dovato will be made available through the manufacturer's free drug program and will be shipped directly to the patient via the manufacturer's contracted specialty pharmacy.  [facility] mail order pharmacy  ??  A representative from the manufacturer program will reach out to the patient to schedule a delivery.  Patient can follow up with manufacturer if needed via phone# @ [phone].  ??  In the event of a dose change for the approved medication or if additional refills are needed the clinic team can contact the dispensing pharmacy at;  Phone Number: [phone]  Fax Number: [phone]    Additional information provided to the patient: none. I called and verified she has received a 3 month supply of Dovato from the pharmacy stated above.    Roderic Palau  [facility] Specialty Pharmacist

## 2021-09-27 MED ORDER — ALBUTEROL SULFATE HFA 90 MCG/ACTUATION AEROSOL INHALER
Freq: Four times a day (QID) | RESPIRATORY_TRACT | 0 refills | 0.00000 days | Status: CP | PRN
Start: 2021-09-27 — End: 2022-09-27

## 2021-10-04 ENCOUNTER — Ambulatory Visit: Admit: 2021-10-04 | Discharge: 2021-10-05

## 2021-10-04 DIAGNOSIS — B20 Human immunodeficiency virus [HIV] disease: Principal | ICD-10-CM

## 2021-10-04 DIAGNOSIS — R0789 Other chest pain: Principal | ICD-10-CM

## 2022-01-02 ENCOUNTER — Ambulatory Visit: Admit: 2022-01-02 | Discharge: 2022-01-03

## 2022-01-02 DIAGNOSIS — B20 Human immunodeficiency virus [HIV] disease: Principal | ICD-10-CM

## 2022-04-12 MED ORDER — METFORMIN 500 MG TABLET
ORAL_TABLET | Freq: Two times a day (BID) | ORAL | 1 refills | 30 days | Status: CP
Start: 2022-04-12 — End: ?

## 2022-05-09 ENCOUNTER — Ambulatory Visit: Admit: 2022-05-09 | Discharge: 2022-05-10

## 2022-05-09 DIAGNOSIS — E119 Type 2 diabetes mellitus without complications: Principal | ICD-10-CM

## 2022-05-09 DIAGNOSIS — K649 Unspecified hemorrhoids: Principal | ICD-10-CM

## 2022-05-09 DIAGNOSIS — R0602 Shortness of breath: Principal | ICD-10-CM

## 2022-05-09 DIAGNOSIS — F419 Anxiety disorder, unspecified: Principal | ICD-10-CM

## 2022-05-09 DIAGNOSIS — F32A Anxiety and depression: Principal | ICD-10-CM

## 2022-05-09 DIAGNOSIS — B20 Human immunodeficiency virus [HIV] disease: Principal | ICD-10-CM

## 2022-05-09 DIAGNOSIS — R7989 Other specified abnormal findings of blood chemistry: Principal | ICD-10-CM

## 2022-05-09 MED ORDER — TRULICITY 0.75 MG/0.5 ML SUBCUTANEOUS PEN INJECTOR
SUBCUTANEOUS | 0 refills | 28 days | Status: CP
Start: 2022-05-09 — End: 2022-05-31

## 2022-05-15 ENCOUNTER — Ambulatory Visit: Admit: 2022-05-15 | Discharge: 2022-05-16

## 2022-05-15 DIAGNOSIS — R0602 Shortness of breath: Principal | ICD-10-CM

## 2022-06-06 MED ORDER — TRULICITY 1.5 MG/0.5 ML SUBCUTANEOUS PEN INJECTOR
SUBCUTANEOUS | 0 refills | 28 days | Status: CP
Start: 2022-06-06 — End: 2022-06-28

## 2022-06-26 ENCOUNTER — Ambulatory Visit: Admit: 2022-06-26 | Discharge: 2022-06-27

## 2022-06-26 DIAGNOSIS — B20 Human immunodeficiency virus [HIV] disease: Principal | ICD-10-CM

## 2022-06-26 DIAGNOSIS — R87610 Atypical squamous cells of undetermined significance on cytologic smear of cervix (ASC-US): Principal | ICD-10-CM

## 2022-06-26 DIAGNOSIS — E119 Type 2 diabetes mellitus without complications: Principal | ICD-10-CM

## 2022-06-26 DIAGNOSIS — R0602 Shortness of breath: Principal | ICD-10-CM

## 2022-06-26 DIAGNOSIS — L659 Nonscarring hair loss, unspecified: Principal | ICD-10-CM

## 2022-06-26 MED ORDER — CITALOPRAM 10 MG TABLET
ORAL_TABLET | Freq: Every day | ORAL | 3 refills | 90 days | Status: CP
Start: 2022-06-26 — End: 2023-06-26

## 2022-07-19 DIAGNOSIS — B20 Human immunodeficiency virus [HIV] disease: Principal | ICD-10-CM

## 2022-07-27 ENCOUNTER — Ambulatory Visit: Admit: 2022-07-27 | Discharge: 2022-07-28 | Attending: Obstetrics & Gynecology | Primary: Obstetrics & Gynecology

## 2022-07-27 DIAGNOSIS — R87610 Atypical squamous cells of undetermined significance on cytologic smear of cervix (ASC-US): Principal | ICD-10-CM

## 2022-07-27 DIAGNOSIS — N898 Other specified noninflammatory disorders of vagina: Principal | ICD-10-CM

## 2022-07-27 DIAGNOSIS — B20 Human immunodeficiency virus [HIV] disease: Principal | ICD-10-CM

## 2022-07-27 DIAGNOSIS — R300 Dysuria: Principal | ICD-10-CM

## 2022-07-27 MED ORDER — NITROFURANTOIN MONOHYDRATE/MACROCRYSTALS 100 MG CAPSULE
ORAL_CAPSULE | Freq: Two times a day (BID) | ORAL | 0 refills | 5 days | Status: CP
Start: 2022-07-27 — End: 2022-08-01

## 2022-07-27 MED ORDER — FLUCONAZOLE 150 MG TABLET
ORAL_TABLET | 0 refills | 0 days | Status: CP
Start: 2022-07-27 — End: ?

## 2022-08-07 MED ORDER — METFORMIN 500 MG TABLET
ORAL_TABLET | Freq: Two times a day (BID) | ORAL | 2 refills | 15 days | Status: CP
Start: 2022-08-07 — End: ?

## 2022-09-10 MED ORDER — METFORMIN 500 MG TABLET
ORAL_TABLET | Freq: Two times a day (BID) | ORAL | 2 refills | 15 days | Status: CP
Start: 2022-09-10 — End: ?

## 2022-09-24 IMAGING — CR DG CHEST 2V
2 series · 2 of 2 positions shown · non-contrast
Comparison: None.

CLINICAL DATA: Short of breath, palpitations, chest pressure

EXAM:
CHEST - 2 VIEW

[chest pa]
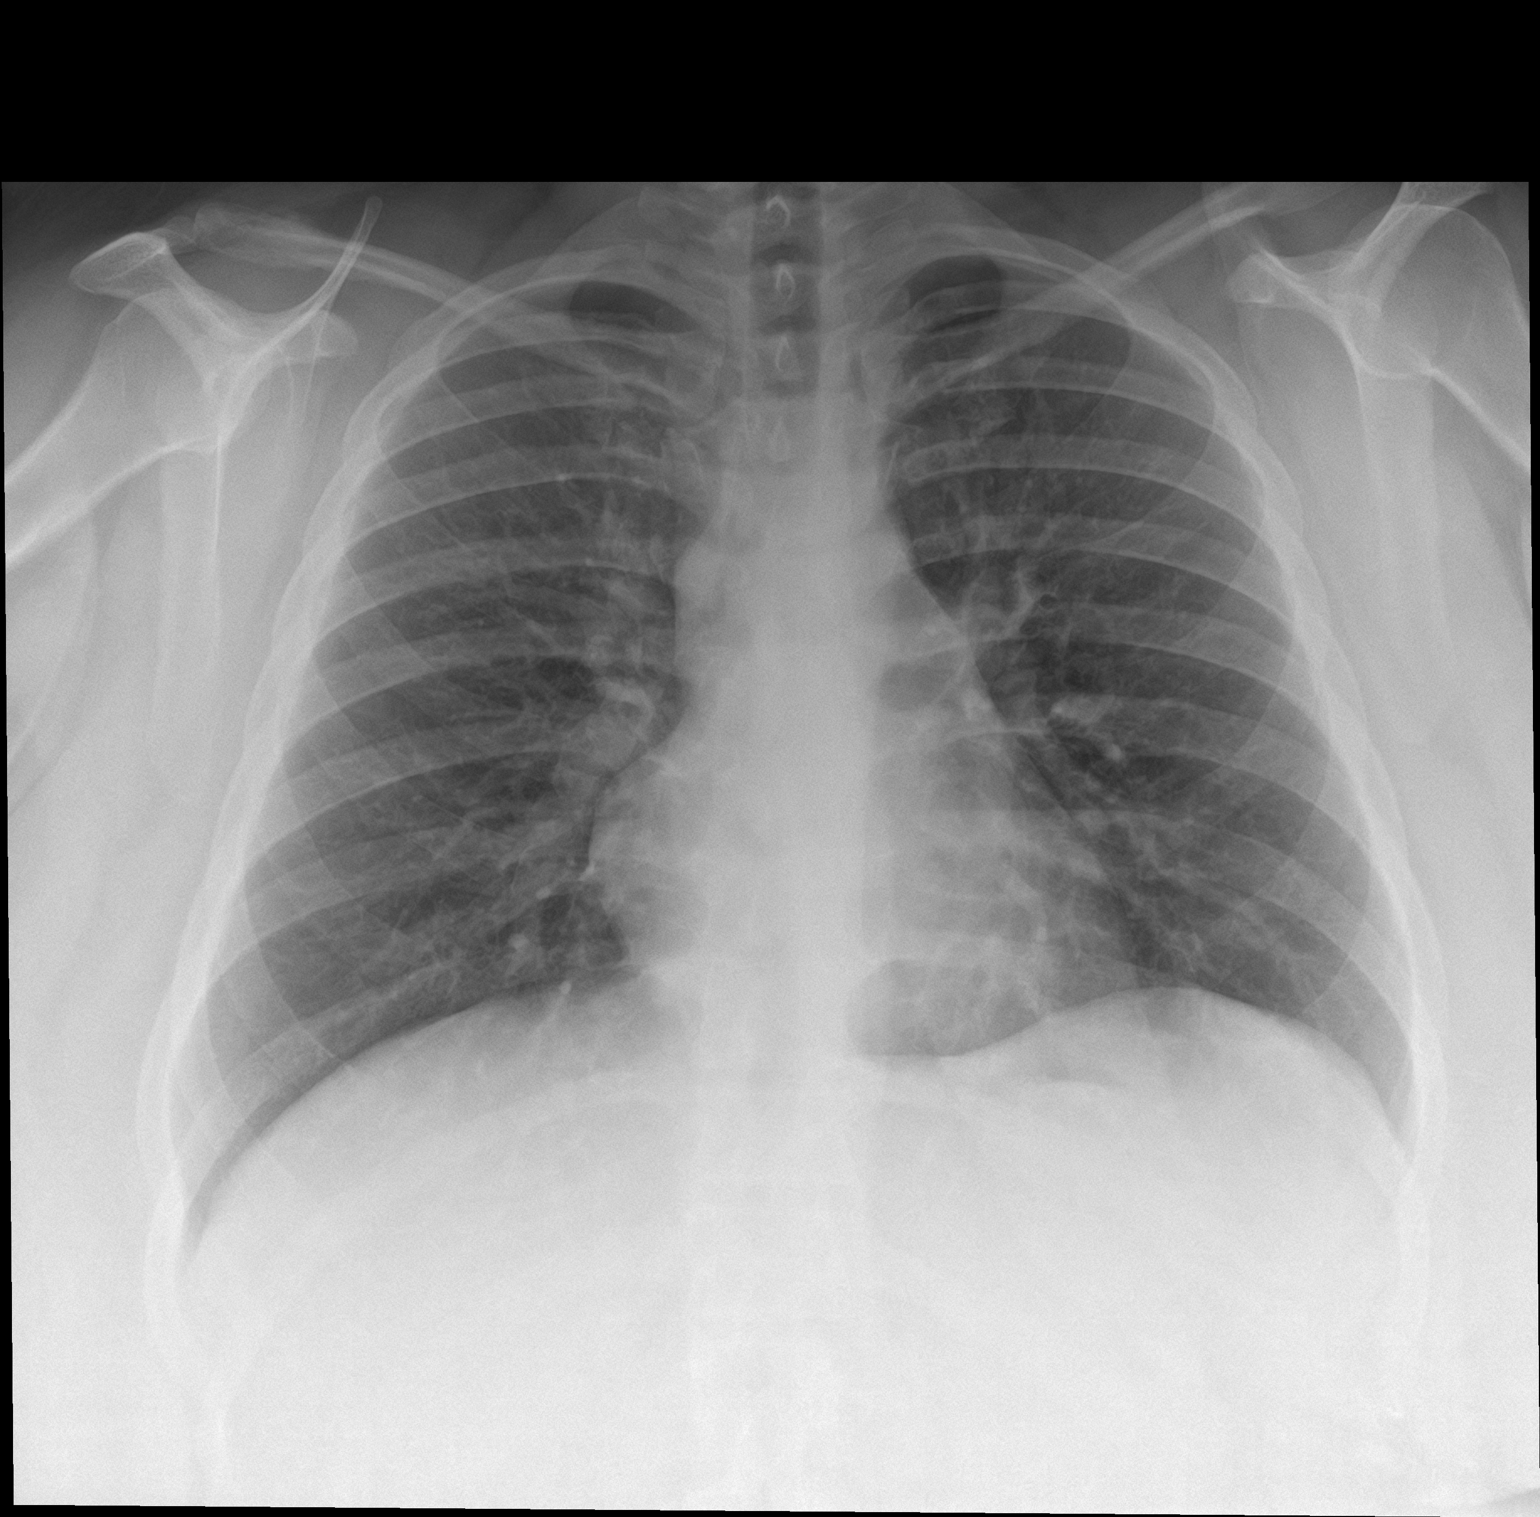

[chest lat]
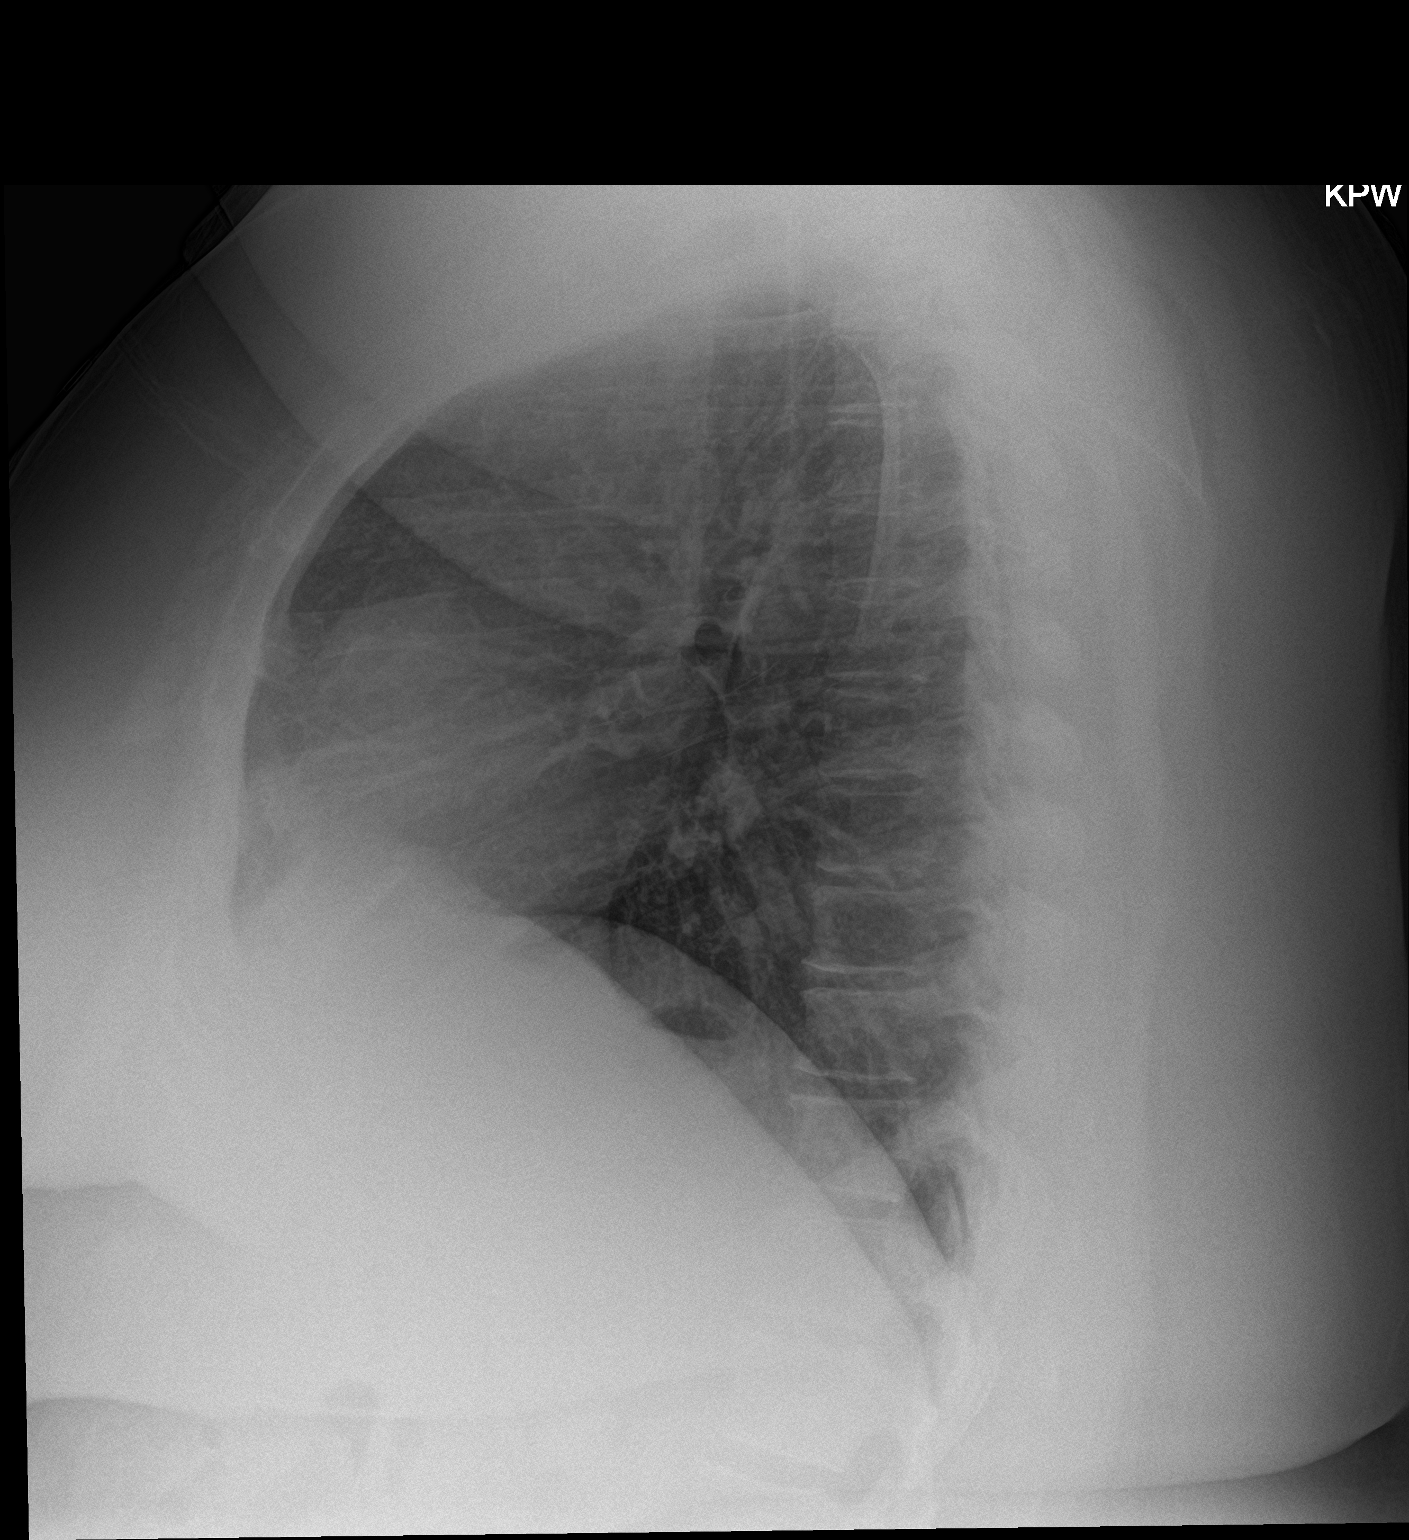

[2 of 2 positions shown; findings below may reference images not displayed]

FINDINGS: The heart size and mediastinal contours are within normal limits.
Both lungs are clear. The visualized skeletal structures are
unremarkable.
IMPRESSION: No active cardiopulmonary disease.

## 2022-09-26 ENCOUNTER — Ambulatory Visit: Admit: 2022-09-26 | Discharge: 2022-09-27

## 2022-09-26 DIAGNOSIS — B20 Human immunodeficiency virus [HIV] disease: Principal | ICD-10-CM

## 2022-09-26 DIAGNOSIS — R0602 Shortness of breath: Principal | ICD-10-CM

## 2022-09-26 DIAGNOSIS — F32A Anxiety and depression: Principal | ICD-10-CM

## 2022-09-26 DIAGNOSIS — R7989 Other specified abnormal findings of blood chemistry: Principal | ICD-10-CM

## 2022-09-26 DIAGNOSIS — E119 Type 2 diabetes mellitus without complications: Principal | ICD-10-CM

## 2022-09-26 DIAGNOSIS — Z23 Encounter for immunization: Principal | ICD-10-CM

## 2022-09-26 DIAGNOSIS — F419 Anxiety disorder, unspecified: Principal | ICD-10-CM

## 2022-09-26 DIAGNOSIS — Z0000 Encounter for general adult medical examination without abnormal findings: Principal | ICD-10-CM

## 2022-09-26 MED ORDER — METFORMIN 500 MG TABLET
ORAL_TABLET | Freq: Two times a day (BID) | ORAL | 11 refills | 30 days | Status: CP
Start: 2022-09-26 — End: ?

## 2022-09-26 MED ORDER — DOVATO 50 MG-300 MG TABLET
ORAL_TABLET | Freq: Every day | ORAL | 4 refills | 30 days | Status: CP
Start: 2022-09-26 — End: ?

## 2022-09-26 MED ORDER — FLULAVAL QUAD 2022-2023 (PF) 60 MCG (15 MCG X 4)/0.5 ML IM SYRINGE
Freq: Once | INTRAMUSCULAR | 0 refills | 1 days | Status: CN
Start: 2022-09-26 — End: 2022-09-26

## 2022-11-30 ENCOUNTER — Institutional Professional Consult (permissible substitution): Admit: 2022-11-30 | Discharge: 2022-12-01 | Attending: Registered" | Primary: Registered"

## 2023-02-12 ENCOUNTER — Ambulatory Visit: Admit: 2023-02-12 | Discharge: 2023-02-13

## 2023-02-12 DIAGNOSIS — B20 Human immunodeficiency virus [HIV] disease: Principal | ICD-10-CM

## 2023-02-12 DIAGNOSIS — E119 Type 2 diabetes mellitus without complications: Principal | ICD-10-CM

## 2023-02-12 MED ORDER — DOVATO 50 MG-300 MG TABLET
ORAL_TABLET | Freq: Every day | ORAL | 5 refills | 30.00000 days | Status: CP
Start: 2023-02-12 — End: 2023-02-12

## 2023-02-14 DIAGNOSIS — B20 Human immunodeficiency virus [HIV] disease: Principal | ICD-10-CM

## 2023-02-14 MED ORDER — DOVATO 50 MG-300 MG TABLET
ORAL_TABLET | Freq: Every day | ORAL | 5 refills | 30 days | Status: CP
Start: 2023-02-14 — End: ?

## 2023-03-28 ENCOUNTER — Ambulatory Visit: Admit: 2023-03-28 | Discharge: 2023-03-29

## 2023-03-28 DIAGNOSIS — F419 Anxiety disorder, unspecified: Principal | ICD-10-CM

## 2023-03-28 DIAGNOSIS — N879 Dysplasia of cervix uteri, unspecified: Principal | ICD-10-CM

## 2023-03-28 DIAGNOSIS — Z91849 Unspecified risk for dental caries: Principal | ICD-10-CM

## 2023-03-28 DIAGNOSIS — B20 Human immunodeficiency virus [HIV] disease: Principal | ICD-10-CM

## 2023-03-28 DIAGNOSIS — E119 Type 2 diabetes mellitus without complications: Principal | ICD-10-CM

## 2023-03-28 DIAGNOSIS — F32A Anxiety and depression: Principal | ICD-10-CM

## 2023-03-28 DIAGNOSIS — R0602 Shortness of breath: Principal | ICD-10-CM

## 2023-03-28 DIAGNOSIS — R7989 Other specified abnormal findings of blood chemistry: Principal | ICD-10-CM

## 2023-03-28 DIAGNOSIS — Z0000 Encounter for general adult medical examination without abnormal findings: Principal | ICD-10-CM

## 2023-03-28 DIAGNOSIS — N979 Female infertility, unspecified: Principal | ICD-10-CM

## 2023-03-28 MED ORDER — PHENTERMINE 15 MG CAPSULE
ORAL_CAPSULE | Freq: Every morning | ORAL | 3 refills | 30 days | Status: CP
Start: 2023-03-28 — End: ?

## 2023-04-09 ENCOUNTER — Ambulatory Visit: Admit: 2023-04-09 | Discharge: 2023-04-09

## 2023-04-09 DIAGNOSIS — Z3A01 Less than 8 weeks gestation of pregnancy: Principal | ICD-10-CM

## 2023-04-09 DIAGNOSIS — B20 Human immunodeficiency virus [HIV] disease: Principal | ICD-10-CM

## 2023-04-09 DIAGNOSIS — Z111 Encounter for screening for respiratory tuberculosis: Principal | ICD-10-CM

## 2023-04-09 MED ORDER — TRIUMEQ 600 MG-50 MG-300 MG TABLET
ORAL_TABLET | Freq: Every day | ORAL | 8 refills | 30 days | Status: CP
Start: 2023-04-09 — End: ?

## 2023-04-10 ENCOUNTER — Inpatient Hospital Stay (HOSPITAL_COMMUNITY)
Admission: AD | Admit: 2023-04-10 | Discharge: 2023-04-10 | Disposition: A | Discharge status: Home / Self Care | Payer: Self-pay | Attending: Obstetrics and Gynecology | Admitting: Obstetrics and Gynecology

## 2023-04-10 ENCOUNTER — Encounter (HOSPITAL_COMMUNITY): Payer: Self-pay | Admitting: Obstetrics and Gynecology

## 2023-04-10 DIAGNOSIS — O98711 Human immunodeficiency virus [HIV] disease complicating pregnancy, first trimester: Secondary | ICD-10-CM | POA: Insufficient documentation

## 2023-04-10 DIAGNOSIS — Z3201 Encounter for pregnancy test, result positive: Secondary | ICD-10-CM | POA: Insufficient documentation

## 2023-04-10 DIAGNOSIS — Z21 Asymptomatic human immunodeficiency virus [HIV] infection status: Secondary | ICD-10-CM | POA: Insufficient documentation

## 2023-04-10 DIAGNOSIS — O24111 Pre-existing diabetes mellitus, type 2, in pregnancy, first trimester: Secondary | ICD-10-CM | POA: Insufficient documentation

## 2023-04-10 DIAGNOSIS — Z3A01 Less than 8 weeks gestation of pregnancy: Secondary | ICD-10-CM | POA: Insufficient documentation

## 2023-04-10 DIAGNOSIS — O0990 Supervision of high risk pregnancy, unspecified, unspecified trimester: Principal | ICD-10-CM

## 2023-04-10 NOTE — MAU Note (Signed)
.  Jamie Heath is a 32 y.o. at [redacted]w[redacted]d here in MAU reporting: She reports she took a pregnancy test today and it was negative so she became fearful. She reports [facility]. She was seen in an Urgent Care two days ago and had a positive UPT. She reports yesterday she had blood work drawn by her provider and she got the results back today but did not understand them. She reports with the negative home pregnancy test and not understanding the blood work, she wanted to ensure she was still pregnant and that everything was okay. She reports a long history of health problems and she has been desiring to be pregnant for a long time. Patient crying in triage. Denies pain and VB. Denies any other complaints.  -Type 2 DM, taking Metformin. A1C <6% at last check.  -HIV. Last CD4 count 800 and undetectable viral load per note in Care Everywhere on 5/21 from her Infectiouse Diseases appointment. Just switched to Triumeg from Dovato due to pregnancy.  LMP: 03/01/2023 Onset of complaint: Today Pain score: Denies pain.  Lab orders placed from triage:  none

## 2023-04-11 ENCOUNTER — Other Ambulatory Visit: Payer: Self-pay

## 2023-04-11 ENCOUNTER — Inpatient Hospital Stay (HOSPITAL_COMMUNITY)
Admission: AD | Admit: 2023-04-11 | Discharge: 2023-04-11 | Disposition: A | Discharge status: Home / Self Care | Payer: Self-pay | Attending: Obstetrics & Gynecology | Admitting: Obstetrics & Gynecology

## 2023-04-11 DIAGNOSIS — Z3A01 Less than 8 weeks gestation of pregnancy: Secondary | ICD-10-CM

## 2023-04-11 DIAGNOSIS — O039 Complete or unspecified spontaneous abortion without complication: Secondary | ICD-10-CM | POA: Insufficient documentation

## 2023-04-11 DIAGNOSIS — O209 Hemorrhage in early pregnancy, unspecified: Secondary | ICD-10-CM

## 2023-04-11 DIAGNOSIS — O99820 Streptococcus B carrier state complicating pregnancy: Principal | ICD-10-CM

## 2023-04-11 LAB — CBC
HCT: 45.1 % (ref 36.0–46.0)
Hemoglobin: 14.4 g/dL (ref 12.0–15.0)
MCH: 25.6 pg — ABNORMAL LOW (ref 26.0–34.0)
MCHC: 31.9 g/dL (ref 30.0–36.0)
MCV: 80.2 fL (ref 80.0–100.0)
Platelets: 284 10*3/uL (ref 150–400)
RBC: 5.62 MIL/uL — ABNORMAL HIGH (ref 3.87–5.11)
RDW: 13.8 % (ref 11.5–15.5)
WBC: 8.8 10*3/uL (ref 4.0–10.5)
nRBC: 0 % (ref 0.0–0.2)

## 2023-04-11 LAB — ABO/RH: ABO/RH(D): B POS

## 2023-04-11 LAB — URINALYSIS, ROUTINE W REFLEX MICROSCOPIC
Bilirubin Urine: NEGATIVE
Glucose, UA: NEGATIVE mg/dL
Ketones, ur: NEGATIVE mg/dL
Nitrite: NEGATIVE
Protein, ur: 100 mg/dL — AB
Specific Gravity, Urine: 1.02 (ref 1.005–1.030)
pH: 6 (ref 5.0–8.0)

## 2023-04-11 LAB — URINALYSIS, MICROSCOPIC (REFLEX)
Bacteria, UA: NONE SEEN
RBC / HPF: >50 RBC/hpf (ref 0–5)

## 2023-04-11 LAB — WET PREP, GENITAL
Clue Cells Wet Prep HPF POC: NONE SEEN
Sperm: NONE SEEN
Trich, Wet Prep: NONE SEEN
WBC, Wet Prep HPF POC: <10 (ref <10)
Yeast Wet Prep HPF POC: NONE SEEN

## 2023-04-11 LAB — HCG, QUANTITATIVE, PREGNANCY: hCG, Beta Chain, Quant, S: 23 m[IU]/mL — ABNORMAL HIGH (ref <5)

## 2023-04-11 MED ORDER — AMOXICILLIN 500 MG CAPSULE
ORAL_CAPSULE | Freq: Three times a day (TID) | ORAL | 0 refills | 5 days | Status: CP
Start: 2023-04-11 — End: ?

## 2023-04-11 NOTE — MAU Note (Signed)
Jamie Heath is a 32 y.o. at [redacted]w[redacted]d here in MAU reporting: she's pregnant and having VB that began yesterday morning.  Reports VB has continued with wiping and passed 2 nickel sized clots.  Reports had minimal lower lower abdominal cramping, no pain currently.  Denies recent intercourse. LMP: 03/31/2023 Onset of complaint: yesterday Pain score: 0 Vitals:   04/11/23 1707  BP: 110/63  Pulse: (!) 102  Resp: 18  Temp: 97.7 F (36.5 C)  SpO2: 100%     FHT:NA Lab orders placed from triage:  UA

## 2023-04-11 NOTE — MAU Provider Note (Signed)
History     CSN: [id number]  Arrival date and time: 04/11/23 1654   Event Date/Time   First Provider Initiated Contact with Patient 04/11/23 1921      Chief Complaint  Patient presents with   Vaginal Bleeding   HPI  Jamie Heath is a 32 y.o. G1P0000 at [redacted]w[redacted]d who presents for evaluation of vaginal bleeding. Patient reports she was seen on 5/21 at her primary office and was told she had a HCG of 40. She reports today she is having vaginal bleeding like a period. She reports intermittent lower abdominal cramping. Patient rates the pain as a 4/10 and has not tried anything for the pain. She denies any discharge. Denies any constipation, diarrhea or any urinary complaints.   OB History     Gravida  1   Para  0   Term  0   Preterm  0   AB  0   Living  0      SAB  0   IAB  0   Ectopic  0   Multiple  0   Live Births  0           Past Medical History:  Diagnosis Date   Breast discharge    HIV (human immunodeficiency virus infection) (HCC)     Past Surgical History:  Procedure Laterality Date   IRRIGATION AND DEBRIDEMENT ABSCESS Right 09/30/2015   Procedure: IRRIGATION AND DEBRIDEMENT FLANK ABSCESS;  Surgeon: Rodman Pickle, MD;  Location: [facility] OR;  Service: General;  Laterality: Right;    Family History  Problem Relation Age of Onset   Diabetes Maternal Grandmother    Cancer Maternal Grandfather     Social History   Tobacco Use   Smoking status: Never  Substance Use Topics   Alcohol use: No   Drug use: No    Allergies: No Known Allergies  No medications prior to admission.    Review of Systems  Constitutional: Negative.  Negative for fatigue and fever.  HENT: Negative.    Respiratory: Negative.  Negative for shortness of breath.   Cardiovascular: Negative.  Negative for chest pain.  Gastrointestinal:  Positive for abdominal pain. Negative for constipation, diarrhea, nausea and vomiting.  Genitourinary:  Positive for vaginal  bleeding. Negative for dysuria and vaginal discharge.  Neurological: Negative.  Negative for dizziness and headaches.   Physical Exam   Blood pressure 110/63, pulse (!) 102, temperature 97.7 F (36.5 C), resp. rate 18, weight 117.3 kg, last menstrual period 03/01/2023, SpO2 100 %.  Patient Vitals for the past 24 hrs:  BP Temp Pulse Resp SpO2 Weight  04/11/23 1707 110/63 97.7 F (36.5 C) (!) 102 18 100 % --  04/11/23 1706 -- -- -- -- -- 117.3 kg    Physical Exam Vitals and nursing note reviewed.  Constitutional:      General: She is not in acute distress.    Appearance: She is well-developed.  HENT:     Head: Normocephalic.  Eyes:     Pupils: Pupils are equal, round, and reactive to light.  Cardiovascular:     Rate and Rhythm: Normal rate and regular rhythm.     Heart sounds: Normal heart sounds.  Pulmonary:     Effort: Pulmonary effort is normal. No respiratory distress.     Breath sounds: Normal breath sounds.  Abdominal:     General: Bowel sounds are normal. There is no distension.     Palpations: Abdomen is soft.  Tenderness: There is no abdominal tenderness.  Skin:    General: Skin is warm and dry.  Neurological:     Mental Status: She is alert and oriented to person, place, and time.  Psychiatric:        Mood and Affect: Mood normal.        Behavior: Behavior normal.        Thought Content: Thought content normal.        Judgment: Judgment normal.      MAU Course  Procedures  Results for orders placed or performed during the hospital encounter of 04/11/23 (from the past 24 hour(s))  CBC     Status: Abnormal   Collection Time: 04/11/23  5:29 PM  Result Value Ref Range   WBC 8.8 4.0 - 10.5 K/uL   RBC 5.62 (H) 3.87 - 5.11 MIL/uL   Hemoglobin 14.4 12.0 - 15.0 g/dL   HCT 56.2 13.0 - 86.5 %   MCV 80.2 80.0 - 100.0 fL   MCH 25.6 (L) 26.0 - 34.0 pg   MCHC 31.9 30.0 - 36.0 g/dL   RDW 78.4 69.6 - 29.5 %   Platelets 284 150 - 400 K/uL   nRBC 0.0 0.0 - 0.2 %   hCG, quantitative, pregnancy     Status: Abnormal   Collection Time: 04/11/23  5:29 PM  Result Value Ref Range   hCG, Beta Chain, Quant, S 23 (H) <5 mIU/mL  ABO/Rh     Status: None   Collection Time: 04/11/23  5:29 PM  Result Value Ref Range   ABO/RH(D) B POS    No rh immune globuloin      NOT A RH IMMUNE GLOBULIN CANDIDATE, PT RH POSITIVE Performed at [facility], [address], [place], [place] [address]   Urinalysis, Routine w reflex microscopic -Urine, Clean Catch     Status: Abnormal   Collection Time: 04/11/23  5:30 PM  Result Value Ref Range   Color, Urine RED (A) YELLOW   APPearance TURBID (A) CLEAR   Specific Gravity, Urine 1.020 1.005 - 1.030   pH 6.0 5.0 - 8.0   Glucose, UA NEGATIVE NEGATIVE mg/dL   Hgb urine dipstick LARGE (A) NEGATIVE   Bilirubin Urine NEGATIVE NEGATIVE   Ketones, ur NEGATIVE NEGATIVE mg/dL   Protein, ur 244 (A) NEGATIVE mg/dL   Nitrite NEGATIVE NEGATIVE   Leukocytes,Ua SMALL (A) NEGATIVE  Urinalysis, Microscopic (reflex)     Status: None   Collection Time: 04/11/23  5:30 PM  Result Value Ref Range   RBC / HPF >50 0 - 5 RBC/hpf   WBC, UA 0-5 0 - 5 WBC/hpf   Bacteria, UA NONE SEEN NONE SEEN   Squamous Epithelial / HPF 0-5 0 - 5 /HPF   Mucus PRESENT   Wet prep, genital     Status: None   Collection Time: 04/11/23  5:38 PM   Specimen: PATH Cytology Cervicovaginal Ancillary Only  Result Value Ref Range   Yeast Wet Prep HPF POC NONE SEEN NONE SEEN   Trich, Wet Prep NONE SEEN NONE SEEN   Clue Cells Wet Prep HPF POC NONE SEEN NONE SEEN   WBC, Wet Prep HPF POC <10 <10   Sperm NONE SEEN      No results found.   MDM Labs ordered and reviewed.   UA, UPT CBC, HCG ABO/Rh- B Pos Wet prep and gc/chlamydia  CNM informed patient of results of ultrasound showing missed AB. Condolences provided and space given for patient  to process emotions. CNM discussed no need for intervention at this time.  Assessment and Plan   1. Miscarriage   2.  [redacted] weeks gestation of pregnancy   3. Vaginal bleeding affecting early pregnancy     -Discharge home in stable condition -Vaginal bleeding and pain precautions discussed -Patient advised to follow-up with OB as scheduled for SAB follow up, message sent -Patient may return to MAU as needed or if her condition were to change or worsen  Rolm Bookbinder, CNM 04/11/2023, 7:21 PM

## 2023-04-11 NOTE — Discharge Instructions (Signed)

## 2023-04-12 LAB — GC/CHLAMYDIA PROBE AMP (~~LOC~~) NOT AT ARMC
Chlamydia: NEGATIVE
Comment: NEGATIVE
Comment: NORMAL
Neisseria Gonorrhea: NEGATIVE

## 2023-04-16 ENCOUNTER — Telehealth: Payer: Self-pay

## 2023-04-16 ENCOUNTER — Ambulatory Visit: Payer: Self-pay

## 2023-04-16 DIAGNOSIS — O039 Complete or unspecified spontaneous abortion without complication: Secondary | ICD-10-CM

## 2023-04-16 NOTE — Telephone Encounter (Signed)
Called patient with [facility], Karla--ID [phone], with number listed in chart. Patient Id'd with name and DOB.   Patient unaware that she needed to come in for appointment today; rescheduled appointment for tomorrow afternoon at [facility].   Maureen Ralphs RN on 04/16/23 at 1137

## 2023-04-17 ENCOUNTER — Ambulatory Visit: Payer: Self-pay

## 2023-04-17 ENCOUNTER — Ambulatory Visit: Admit: 2023-04-17 | Discharge: 2023-04-18

## 2023-04-17 DIAGNOSIS — O039 Complete or unspecified spontaneous abortion without complication: Secondary | ICD-10-CM

## 2023-04-17 NOTE — Addendum Note (Signed)
Addended by: Faythe Casa on: 04/17/2023 12:16 PM   Modules accepted: Orders

## 2023-04-18 LAB — BETA HCG QUANT (REF LAB): hCG Quant: 18 m[IU]/mL

## 2023-04-24 ENCOUNTER — Other Ambulatory Visit: Payer: Self-pay

## 2023-04-24 DIAGNOSIS — O039 Complete or unspecified spontaneous abortion without complication: Secondary | ICD-10-CM

## 2023-04-25 ENCOUNTER — Telehealth: Payer: Self-pay

## 2023-04-25 LAB — BETA HCG QUANT (REF LAB): hCG Quant: 38 m[IU]/mL

## 2023-04-25 NOTE — Telephone Encounter (Addendum)
-----   Message from [name], MD sent at 04/25/2023  8:25 AM EDT ----- Notify patient of abnormal rise in pregnancy hormone level and recommend treatment with methotrexate for concern of ectopic pregnancy. Patient should go to MAU to receive methotrexate.   Pt notified of results and provider recommendation.  Pt agreed to go to MAU for tx.  MAU nurse Kaylan informed.   Jamie Heath  04/25/23

## 2023-04-26 ENCOUNTER — Observation Stay (HOSPITAL_COMMUNITY)
Admission: AD | Admit: 2023-04-26 | Discharge: 2023-04-28 | Disposition: A | Discharge status: Home / Self Care | Payer: Self-pay | Attending: Obstetrics and Gynecology | Admitting: Obstetrics and Gynecology

## 2023-04-26 ENCOUNTER — Other Ambulatory Visit: Payer: Self-pay

## 2023-04-26 ENCOUNTER — Inpatient Hospital Stay (HOSPITAL_COMMUNITY): Payer: Self-pay

## 2023-04-26 DIAGNOSIS — B20 Human immunodeficiency virus [HIV] disease: Secondary | ICD-10-CM | POA: Insufficient documentation

## 2023-04-26 DIAGNOSIS — O00102 Left tubal pregnancy without intrauterine pregnancy: Principal | ICD-10-CM | POA: Insufficient documentation

## 2023-04-26 DIAGNOSIS — Z9889 Other specified postprocedural states: Secondary | ICD-10-CM

## 2023-04-26 DIAGNOSIS — Z21 Asymptomatic human immunodeficiency virus [HIV] infection status: Secondary | ICD-10-CM | POA: Insufficient documentation

## 2023-04-26 DIAGNOSIS — O009 Unspecified ectopic pregnancy without intrauterine pregnancy: Secondary | ICD-10-CM | POA: Diagnosis present

## 2023-04-26 DIAGNOSIS — R7309 Other abnormal glucose: Secondary | ICD-10-CM | POA: Insufficient documentation

## 2023-04-26 LAB — COMPREHENSIVE METABOLIC PANEL
ALT: 86 U/L — ABNORMAL HIGH (ref 0–44)
AST: 53 U/L — ABNORMAL HIGH (ref 15–41)
Albumin: 3.8 g/dL (ref 3.5–5.0)
Alkaline Phosphatase: 45 U/L (ref 38–126)
Anion gap: 9 (ref 5–15)
BUN: 10 mg/dL (ref 6–20)
CO2: 24 mmol/L (ref 22–32)
Calcium: 9.1 mg/dL (ref 8.9–10.3)
Chloride: 105 mmol/L (ref 98–111)
Creatinine, Ser: 0.87 mg/dL (ref 0.44–1.00)
GFR, Estimated: >60 mL/min (ref >60)
Glucose, Bld: 117 mg/dL — ABNORMAL HIGH (ref 70–99)
Potassium: 3.7 mmol/L (ref 3.5–5.1)
Sodium: 138 mmol/L (ref 135–145)
Total Bilirubin: 0.4 mg/dL (ref 0.3–1.2)
Total Protein: 7.4 g/dL (ref 6.5–8.1)

## 2023-04-26 LAB — CBC WITH DIFFERENTIAL/PLATELET
Abs Immature Granulocytes: 0.05 10*3/uL (ref 0.00–0.07)
Basophils Absolute: 0 10*3/uL (ref 0.0–0.1)
Basophils Relative: 0 %
Eosinophils Absolute: 0 10*3/uL (ref 0.0–0.5)
Eosinophils Relative: 0 %
HCT: 42.1 % (ref 36.0–46.0)
Hemoglobin: 13.4 g/dL (ref 12.0–15.0)
Immature Granulocytes: 1 %
Lymphocytes Relative: 33 %
Lymphs Abs: 3 10*3/uL (ref 0.7–4.0)
MCH: 25.5 pg — ABNORMAL LOW (ref 26.0–34.0)
MCHC: 31.8 g/dL (ref 30.0–36.0)
MCV: 80 fL (ref 80.0–100.0)
Monocytes Absolute: 0.6 10*3/uL (ref 0.1–1.0)
Monocytes Relative: 7 %
Neutro Abs: 5.5 10*3/uL (ref 1.7–7.7)
Neutrophils Relative %: 59 %
Platelets: 242 10*3/uL (ref 150–400)
RBC: 5.26 MIL/uL — ABNORMAL HIGH (ref 3.87–5.11)
RDW: 13.8 % (ref 11.5–15.5)
WBC: 9.2 10*3/uL (ref 4.0–10.5)
nRBC: 0 % (ref 0.0–0.2)

## 2023-04-26 LAB — HCG, QUANTITATIVE, PREGNANCY: hCG, Beta Chain, Quant, S: 50 m[IU]/mL — ABNORMAL HIGH (ref <5)

## 2023-04-26 MED ORDER — SODIUM CHLORIDE 0.9 % IV SOLN: 250.0000 mL | INTRAVENOUS | Status: DC | PRN

## 2023-04-26 MED ORDER — POVIDONE-IODINE 10 % EX SWAB
2.0000 | Freq: Once | CUTANEOUS | Status: AC
  Administered 2023-04-27: 2 via TOPICAL

## 2023-04-26 MED ORDER — ALUM & MAG HYDROXIDE-SIMETH 200-200-20 MG/5ML PO SUSP: 30.0000 mL | ORAL | Status: DC | PRN

## 2023-04-26 MED ORDER — SODIUM CHLORIDE 0.9% FLUSH: 3.0000 mL | INTRAVENOUS | Status: DC | PRN

## 2023-04-26 MED ORDER — CEFAZOLIN IN SODIUM CHLORIDE 3-0.9 GM/100ML-% IV SOLN
3.0000 g | INTRAVENOUS | Status: DC
  Filled 2023-04-26: qty 100

## 2023-04-26 MED ORDER — SODIUM CHLORIDE 0.9% FLUSH
3.0000 mL | Freq: Two times a day (BID) | INTRAVENOUS | Status: DC
  Administered 2023-04-27 (×2): 3 mL via INTRAVENOUS

## 2023-04-26 MED ORDER — SODIUM CHLORIDE 0.45 % IV SOLN: INTRAVENOUS | Status: DC

## 2023-04-26 MED ORDER — IBUPROFEN 600 MG PO TABS
600.0000 mg | ORAL_TABLET | Freq: Four times a day (QID) | ORAL | Status: DC | PRN
  Filled 2023-04-26: qty 1

## 2023-04-26 MED ORDER — SOD CITRATE-CITRIC ACID 500-334 MG/5ML PO SOLN
30.0000 mL | ORAL | Status: AC
  Administered 2023-04-27: 30 mL via ORAL
  Filled 2023-04-26: qty 30

## 2023-04-26 MED ORDER — KETOROLAC TROMETHAMINE 30 MG/ML IJ SOLN
15.0000 mg | INTRAMUSCULAR | Status: AC
  Administered 2023-04-27: 15 mg via INTRAVENOUS
  Filled 2023-04-26: qty 1

## 2023-04-26 NOTE — H&P (Signed)
LABOR AND DELIVERY ADMISSION HISTORY AND PHYSICAL NOTE  Jamie Heath is a 32 y.o. female presenting for overnight observation with planned salpingectomy tomorrow morning. She has been diagnosed with left adnexal ectopic pregnancy. She states she was evaluated about three weeks for suspected SAB. She has not experienced pain or bleeding since that time.   Prenatal History/Complications: Care with [facility] and [facility]  Pregnancy complications:  - Left adnexal ectopic pregnancy - Elevated liver enzymes - HIV Positive  - T2DM  Past Medical History: Past Medical History:  Diagnosis Date   Breast discharge    HIV (human immunodeficiency virus infection) (HCC)     Past Surgical History: Past Surgical History:  Procedure Laterality Date   IRRIGATION AND DEBRIDEMENT ABSCESS Right 09/30/2015   Procedure: IRRIGATION AND DEBRIDEMENT FLANK ABSCESS;  Surgeon: Rodman Pickle, MD;  Location: [facility] OR;  Service: General;  Laterality: Right;    Obstetrical History: OB History     Gravida  1   Para  0   Term  0   Preterm  0   AB  0   Living  0      SAB  0   IAB  0   Ectopic  0   Multiple  0   Live Births  0           Social History: Social History   Socioeconomic History   Marital status: Married    Spouse name: Not on file   Number of children: Not on file   Years of education: Not on file   Highest education level: Not on file  Occupational History   Not on file  Tobacco Use   Smoking status: Never   Smokeless tobacco: Not on file  Substance and Sexual Activity   Alcohol use: No   Drug use: No   Sexual activity: Yes    Birth control/protection: Condom  Other Topics Concern   Not on file  Social History Narrative   Not on file   Social Determinants of Health   Financial Resource Strain: Not on file  Food Insecurity: Not on file  Transportation Needs: Not on file  Physical Activity: Not on file  Stress: Not on file  Social Connections: Not on  file    Family History: Family History  Problem Relation Age of Onset   Diabetes Maternal Grandmother    Cancer Maternal Grandfather     Allergies: No Known Allergies  No medications prior to admission.     Review of Systems  All systems reviewed and negative except as stated in HPI  Physical Exam Blood pressure 127/66, pulse 96, temperature 97.8 F (36.6 C), temperature source Oral, resp. rate 16, height 5\' 3"  (1.6 m), weight 119.2 kg, last menstrual period 03/01/2023. General appearance: alert, oriented Lungs: normal respiratory effort Heart: regular rate Abdomen: soft, non-tender Extremities: No calf swelling or tenderness  Labs: ABO, Rh: --/--/B POS (05/23 1729) Antibody:   HIV:   POS  Results for orders placed or performed during the hospital encounter of 04/26/23 (from the past 24 hour(s))  CBC with Differential/Platelet   Collection Time: 04/26/23  9:09 PM  Result Value Ref Range   WBC 9.2 4.0 - 10.5 K/uL   RBC 5.26 (H) 3.87 - 5.11 MIL/uL   Hemoglobin 13.4 12.0 - 15.0 g/dL   HCT 16.1 09.6 - 04.5 %   MCV 80.0 80.0 - 100.0 fL   MCH 25.5 (L) 26.0 - 34.0 pg   MCHC 31.8 30.0 -  36.0 g/dL   RDW 16.1 09.6 - 04.5 %   Platelets 242 150 - 400 K/uL   nRBC 0.0 0.0 - 0.2 %   Neutrophils Relative % 59 %   Neutro Abs 5.5 1.7 - 7.7 K/uL   Lymphocytes Relative 33 %   Lymphs Abs 3.0 0.7 - 4.0 K/uL   Monocytes Relative 7 %   Monocytes Absolute 0.6 0.1 - 1.0 K/uL   Eosinophils Relative 0 %   Eosinophils Absolute 0.0 0.0 - 0.5 K/uL   Basophils Relative 0 %   Basophils Absolute 0.0 0.0 - 0.1 K/uL   Immature Granulocytes 1 %   Abs Immature Granulocytes 0.05 0.00 - 0.07 K/uL  Comprehensive metabolic panel   Collection Time: 04/26/23  9:09 PM  Result Value Ref Range   Sodium 138 135 - 145 mmol/L   Potassium 3.7 3.5 - 5.1 mmol/L   Chloride 105 98 - 111 mmol/L   CO2 24 22 - 32 mmol/L   Glucose, Bld 117 (H) 70 - 99 mg/dL   BUN 10 6 - 20 mg/dL   Creatinine, Ser 4.09 0.44  - 1.00 mg/dL   Calcium 9.1 8.9 - 81.1 mg/dL   Total Protein 7.4 6.5 - 8.1 g/dL   Albumin 3.8 3.5 - 5.0 g/dL   AST 53 (H) 15 - 41 U/L   ALT 86 (H) 0 - 44 U/L   Alkaline Phosphatase 45 38 - 126 U/L   Total Bilirubin 0.4 0.3 - 1.2 mg/dL   GFR, Estimated >91 >47 mL/min   Anion gap 9 5 - 15  hCG, quantitative, pregnancy   Collection Time: 04/26/23  9:09 PM  Result Value Ref Range   hCG, Beta Chain, Quant, S 50 (H) <5 mIU/mL    Patient Active Problem List   Diagnosis Date Noted   Morbid obesity (HCC) 10/21/2015   Abscess of flank 09/30/2015   US OB Transvaginal  Result Date: 04/26/2023 CLINICAL DATA:  Abdominal pain affecting pregnancy. Abnormal beta HCG. EXAM: TRANSVAGINAL OB ULTRASOUND TECHNIQUE: Transvaginal ultrasound was performed for complete evaluation of the gestation as well as the maternal uterus, adnexal regions, and pelvic cul-de-sac. COMPARISON:  None Available. FINDINGS: Intrauterine gestational sac: None Yolk sac:  Not Visualized. Embryo:  Not Visualized. Cardiac Activity: Not Visualized. Maternal uterus/adnexae: Anteverted uterus. Endometrium measures 6 mm. No intrauterine gestational sac. Left adnexal mass adjacent to the ovary measures 1.5 x 1.1 x 2.3 cm. The ovary appears separate. Right ovary is not definitively seen. Trace free fluid in the pelvis, appearing simple. IMPRESSION: Findings highly suspicious for left adnexal ectopic pregnancy measuring 2.3 cm. No significant free fluid in the pelvis. Critical Value/emergent results were called by telephone at the time of interpretation on 04/26/2023 at 10:20 pm to provider Thalia Bloodgood , who verbally acknowledged these results. Electronically Signed   By: Narda Rutherford M.D.   On: 04/26/2023 22:21    Assessment: -Jamie Heath is a 32 y.o. G1P0000 with stable left adnexal ectopic pregnancy - Patient not a candidate for Methotrexate due to persistently elevated liver enzymes - Observe overnight on OBSC - Plan for surgery  tomorrow morning  Calvert Cantor, MSA, MSN, CNM 04/26/2023, 11:13 PM

## 2023-04-26 NOTE — MAU Note (Signed)
Pt says she was here 2 weeks ago - told she had a SAB. Says labs have decreased Went to clinic - Wed- they called pt on Thursday - told to come to hospital Thursday- but she wanted to talk her Dr. So today she talked to Dr in [facility] today - told pt to come here. No pain No VB

## 2023-04-27 ENCOUNTER — Observation Stay (HOSPITAL_BASED_OUTPATIENT_CLINIC_OR_DEPARTMENT_OTHER): Payer: Self-pay | Admitting: Certified Registered Nurse Anesthetist

## 2023-04-27 ENCOUNTER — Encounter (HOSPITAL_COMMUNITY): Payer: Self-pay | Admitting: Obstetrics and Gynecology

## 2023-04-27 ENCOUNTER — Encounter (HOSPITAL_COMMUNITY)
Admission: AD | Disposition: A | Discharge status: Home / Self Care | Payer: Self-pay | Source: Home / Self Care | Attending: Obstetrics and Gynecology

## 2023-04-27 ENCOUNTER — Other Ambulatory Visit: Payer: Self-pay

## 2023-04-27 ENCOUNTER — Observation Stay (HOSPITAL_COMMUNITY): Payer: Self-pay | Admitting: Certified Registered Nurse Anesthetist

## 2023-04-27 DIAGNOSIS — O00102 Left tubal pregnancy without intrauterine pregnancy: Secondary | ICD-10-CM

## 2023-04-27 DIAGNOSIS — Z3A08 8 weeks gestation of pregnancy: Secondary | ICD-10-CM

## 2023-04-27 DIAGNOSIS — Z9889 Other specified postprocedural states: Secondary | ICD-10-CM

## 2023-04-27 HISTORY — PX: DIAGNOSTIC LAPAROSCOPY WITH REMOVAL OF ECTOPIC PREGNANCY: SHX6449

## 2023-04-27 LAB — CBC
HCT: 40.1 % (ref 36.0–46.0)
HCT: 38.5 % (ref 36.0–46.0)
Hemoglobin: 12.7 g/dL (ref 12.0–15.0)
Hemoglobin: 12.2 g/dL (ref 12.0–15.0)
MCH: 25.4 pg — ABNORMAL LOW (ref 26.0–34.0)
MCH: 25.6 pg — ABNORMAL LOW (ref 26.0–34.0)
MCHC: 31.7 g/dL (ref 30.0–36.0)
MCHC: 31.7 g/dL (ref 30.0–36.0)
MCV: 80.2 fL (ref 80.0–100.0)
MCV: 80.7 fL (ref 80.0–100.0)
Platelets: 221 10*3/uL (ref 150–400)
Platelets: 224 10*3/uL (ref 150–400)
RBC: 5 MIL/uL (ref 3.87–5.11)
RBC: 4.77 MIL/uL (ref 3.87–5.11)
RDW: 13.8 % (ref 11.5–15.5)
RDW: 13.7 % (ref 11.5–15.5)
WBC: 8.5 10*3/uL (ref 4.0–10.5)
WBC: 9.7 10*3/uL (ref 4.0–10.5)
nRBC: 0 % (ref 0.0–0.2)
nRBC: 0 % (ref 0.0–0.2)

## 2023-04-27 LAB — GLUCOSE, CAPILLARY
Glucose-Capillary: 132 mg/dL — ABNORMAL HIGH (ref 70–99)
Glucose-Capillary: 134 mg/dL — ABNORMAL HIGH (ref 70–99)

## 2023-04-27 LAB — BPAM RBC: Unit Type and Rh: 7300

## 2023-04-27 LAB — TYPE AND SCREEN
ABO/RH(D): B POS
Unit division: 0

## 2023-04-27 SURGERY — LAPAROSCOPY, WITH ECTOPIC PREGNANCY SURGICAL TREATMENT
Anesthesia: General | Site: Abdomen | Laterality: Left

## 2023-04-27 MED ORDER — ORAL CARE MOUTH RINSE: 15.0000 mL | Freq: Once | OROMUCOSAL | Status: AC

## 2023-04-27 MED ORDER — 0.9 % SODIUM CHLORIDE (POUR BTL) OPTIME
TOPICAL | Status: DC | PRN
  Administered 2023-04-27: 1000 mL

## 2023-04-27 MED ORDER — ONDANSETRON HCL 4 MG/2ML IJ SOLN
4.0000 mg | Freq: Four times a day (QID) | INTRAMUSCULAR | Status: DC | PRN
  Administered 2023-04-27: 4 mg via INTRAVENOUS
  Filled 2023-04-27: qty 2

## 2023-04-27 MED ORDER — ROCURONIUM BROMIDE 10 MG/ML (PF) SYRINGE
PREFILLED_SYRINGE | INTRAVENOUS | Status: DC | PRN
  Administered 2023-04-27: 70 mg via INTRAVENOUS
  Administered 2023-04-27: 30 mg via INTRAVENOUS

## 2023-04-27 MED ORDER — ONDANSETRON HCL 4 MG PO TABS: 4.0000 mg | ORAL_TABLET | Freq: Four times a day (QID) | ORAL | Status: DC | PRN

## 2023-04-27 MED ORDER — SODIUM CHLORIDE 0.9 % IR SOLN
Status: DC | PRN
  Administered 2023-04-27: 1000 mL

## 2023-04-27 MED ORDER — LIDOCAINE 2% (20 MG/ML) 5 ML SYRINGE
INTRAMUSCULAR | Status: DC | PRN
  Administered 2023-04-27: 40 mg via INTRAVENOUS

## 2023-04-27 MED ORDER — IBUPROFEN 600 MG PO TABS
600.0000 mg | ORAL_TABLET | Freq: Four times a day (QID) | ORAL | Status: DC
  Administered 2023-04-27 – 2023-04-28 (×2): 600 mg via ORAL
  Filled 2023-04-27: qty 1

## 2023-04-27 MED ORDER — DEXAMETHASONE SODIUM PHOSPHATE 10 MG/ML IJ SOLN
INTRAMUSCULAR | Status: DC | PRN
  Administered 2023-04-27: 4 mg via INTRAVENOUS

## 2023-04-27 MED ORDER — SENNOSIDES-DOCUSATE SODIUM 8.6-50 MG PO TABS: 1.0000 | ORAL_TABLET | Freq: Every day | ORAL | 0 refills | Status: DC

## 2023-04-27 MED ORDER — IBUPROFEN 600 MG PO TABS: 600.0000 mg | ORAL_TABLET | Freq: Four times a day (QID) | ORAL | 0 refills | Status: DC

## 2023-04-27 MED ORDER — BUPIVACAINE HCL (PF) 0.25 % IJ SOLN
INTRAMUSCULAR | Status: AC
  Filled 2023-04-27: qty 30

## 2023-04-27 MED ORDER — DOLUTEGRAVIR-LAMIVUDINE 50-300 MG PO TABS
1.0000 | ORAL_TABLET | Freq: Every day | ORAL | Status: DC
  Administered 2023-04-27: 1 via ORAL
  Filled 2023-04-27 (×2): qty 1

## 2023-04-27 MED ORDER — PHENYLEPHRINE 80 MCG/ML (10ML) SYRINGE FOR IV PUSH (FOR BLOOD PRESSURE SUPPORT)
PREFILLED_SYRINGE | INTRAVENOUS | Status: DC | PRN
  Administered 2023-04-27: 80 ug via INTRAVENOUS

## 2023-04-27 MED ORDER — OXYCODONE HCL 5 MG PO TABS
5.0000 mg | ORAL_TABLET | ORAL | Status: DC | PRN
  Administered 2023-04-27: 10 mg via ORAL
  Administered 2023-04-28: 5 mg via ORAL
  Filled 2023-04-27: qty 1
  Filled 2023-04-27: qty 2

## 2023-04-27 MED ORDER — FENTANYL CITRATE (PF) 250 MCG/5ML IJ SOLN
INTRAMUSCULAR | Status: DC | PRN
  Administered 2023-04-27: 100 ug via INTRAVENOUS

## 2023-04-27 MED ORDER — ABACAVIR-DOLUTEGRAVIR-LAMIVUD 600-50-300 MG PO TABS: 1.0000 | ORAL_TABLET | Freq: Every day | ORAL | Status: DC

## 2023-04-27 MED ORDER — CHLORHEXIDINE GLUCONATE 0.12 % MT SOLN
OROMUCOSAL | Status: AC
  Administered 2023-04-27: 15 mL via OROMUCOSAL
  Filled 2023-04-27: qty 15

## 2023-04-27 MED ORDER — ONDANSETRON HCL 4 MG/2ML IJ SOLN
INTRAMUSCULAR | Status: DC | PRN
  Administered 2023-04-27: 4 mg via INTRAVENOUS

## 2023-04-27 MED ORDER — MIDAZOLAM HCL 2 MG/2ML IJ SOLN
INTRAMUSCULAR | Status: AC
  Filled 2023-04-27: qty 2

## 2023-04-27 MED ORDER — ACETAMINOPHEN 10 MG/ML IV SOLN
INTRAVENOUS | Status: DC | PRN
  Administered 2023-04-27: 1000 mg via INTRAVENOUS

## 2023-04-27 MED ORDER — SUGAMMADEX SODIUM 200 MG/2ML IV SOLN
INTRAVENOUS | Status: DC | PRN
  Administered 2023-04-27: 200 mg via INTRAVENOUS

## 2023-04-27 MED ORDER — PROPOFOL 10 MG/ML IV BOLUS
INTRAVENOUS | Status: DC | PRN
  Administered 2023-04-27: 150 mg via INTRAVENOUS

## 2023-04-27 MED ORDER — ONDANSETRON 4 MG PO TBDP: 4.0000 mg | ORAL_TABLET | Freq: Four times a day (QID) | ORAL | 0 refills | Status: DC | PRN

## 2023-04-27 MED ORDER — MIDAZOLAM HCL 2 MG/2ML IJ SOLN
INTRAMUSCULAR | Status: DC | PRN
  Administered 2023-04-27: 2 mg via INTRAVENOUS

## 2023-04-27 MED ORDER — SIMETHICONE 80 MG PO CHEW: 80.0000 mg | CHEWABLE_TABLET | Freq: Four times a day (QID) | ORAL | Status: DC | PRN

## 2023-04-27 MED ORDER — DEXTROSE 5 % IV SOLN
INTRAVENOUS | Status: DC | PRN
  Administered 2023-04-27: 3 g via INTRAVENOUS

## 2023-04-27 MED ORDER — METFORMIN HCL 500 MG PO TABS
500.0000 mg | ORAL_TABLET | Freq: Two times a day (BID) | ORAL | Status: DC
  Administered 2023-04-28: 500 mg via ORAL
  Filled 2023-04-27: qty 1

## 2023-04-27 MED ORDER — ACETAMINOPHEN 10 MG/ML IV SOLN
INTRAVENOUS | Status: AC
  Filled 2023-04-27: qty 100

## 2023-04-27 MED ORDER — FENTANYL CITRATE (PF) 250 MCG/5ML IJ SOLN
INTRAMUSCULAR | Status: AC
  Filled 2023-04-27: qty 5

## 2023-04-27 MED ORDER — PROPOFOL 10 MG/ML IV BOLUS
INTRAVENOUS | Status: AC
  Filled 2023-04-27: qty 20

## 2023-04-27 MED ORDER — CHLORHEXIDINE GLUCONATE 0.12 % MT SOLN
15.0000 mL | Freq: Once | OROMUCOSAL | Status: AC
  Filled 2023-04-27: qty 15

## 2023-04-27 MED ORDER — OXYCODONE HCL 5 MG PO TABS: 5.0000 mg | ORAL_TABLET | ORAL | 0 refills | Status: DC | PRN

## 2023-04-27 MED ORDER — KETOROLAC TROMETHAMINE 30 MG/ML IJ SOLN
30.0000 mg | Freq: Four times a day (QID) | INTRAMUSCULAR | Status: DC
  Administered 2023-04-27: 30 mg via INTRAVENOUS
  Filled 2023-04-27 (×2): qty 1

## 2023-04-27 MED ORDER — HYDROMORPHONE HCL 1 MG/ML IJ SOLN: 0.2000 mg | INTRAMUSCULAR | Status: DC | PRN

## 2023-04-27 MED ORDER — METFORMIN HCL 500 MG PO TABS: 500.0000 mg | ORAL_TABLET | Freq: Every day | ORAL | Status: DC

## 2023-04-27 MED ORDER — LACTATED RINGERS IV SOLN: INTRAVENOUS | Status: DC

## 2023-04-27 MED ORDER — IBUPROFEN 600 MG PO TABS: 600.0000 mg | ORAL_TABLET | Freq: Four times a day (QID) | ORAL | Status: DC

## 2023-04-27 MED ORDER — MENTHOL 3 MG MT LOZG
1.0000 | LOZENGE | OROMUCOSAL | Status: DC | PRN
  Filled 2023-04-27: qty 9

## 2023-04-27 SURGICAL SUPPLY — 35 items
ADH SKN CLS APL DERMABOND .7 (GAUZE/BANDAGES/DRESSINGS) ×1
APL SRG 38 LTWT LNG FL B (MISCELLANEOUS)
APPLICATOR ARISTA FLEXITIP XL (MISCELLANEOUS) IMPLANT
DEFOGGER SCOPE WARMER CLEARIFY (MISCELLANEOUS) IMPLANT
DERMABOND ADVANCED .7 DNX12 (GAUZE/BANDAGES/DRESSINGS) ×1 IMPLANT
DRSG OPSITE POSTOP 3X4 (GAUZE/BANDAGES/DRESSINGS) ×1 IMPLANT
DURAPREP 26ML APPLICATOR (WOUND CARE) ×1 IMPLANT
GLOVE BIOGEL PI IND STRL 8 (GLOVE) ×1 IMPLANT
GLOVE SURG ORTHO 8.0 STRL STRW (GLOVE) ×1 IMPLANT
GOWN STRL REUS W/ TWL LRG LVL3 (GOWN DISPOSABLE) ×1 IMPLANT
GOWN STRL REUS W/ TWL XL LVL3 (GOWN DISPOSABLE) ×1 IMPLANT
GOWN STRL REUS W/TWL LRG LVL3 (GOWN DISPOSABLE) ×1
GOWN STRL REUS W/TWL XL LVL3 (GOWN DISPOSABLE) ×1
HEMOSTAT ARISTA ABSORB 3G PWDR (HEMOSTASIS) IMPLANT
IRRIG SUCT STRYKERFLOW 2 WTIP (MISCELLANEOUS) ×1
IRRIGATION SUCT STRKRFLW 2 WTP (MISCELLANEOUS) IMPLANT
KIT PINK PAD W/HEAD ARE REST (MISCELLANEOUS) ×2 IMPLANT
KIT PINK PAD W/HEAD ARM REST (MISCELLANEOUS) ×2 IMPLANT
KIT TURNOVER KIT B (KITS) ×1 IMPLANT
LIGASURE VESSEL 5MM BLUNT TIP (ELECTROSURGICAL) ×1 IMPLANT
NS IRRIG 1000ML POUR BTL (IV SOLUTION) ×1 IMPLANT
PACK LAPAROSCOPY BASIN (CUSTOM PROCEDURE TRAY) ×1 IMPLANT
PROTECTOR NERVE ULNAR (MISCELLANEOUS) ×2 IMPLANT
SET TUBE SMOKE EVAC HIGH FLOW (TUBING) ×1 IMPLANT
SLEEVE XCEL OPT CAN 5 100 (ENDOMECHANICALS) ×1 IMPLANT
SUT MNCRL AB 4-0 PS2 18 (SUTURE) ×1 IMPLANT
SUT VICRYL 0 UR6 27IN ABS (SUTURE) ×1 IMPLANT
SYS BAG RETRIEVAL 10MM (BASKET) ×1
SYSTEM BAG RETRIEVAL 10MM (BASKET) IMPLANT
TOWEL GREEN STERILE FF (TOWEL DISPOSABLE) ×2 IMPLANT
TRAY FOLEY W/BAG SLVR 14FR (SET/KITS/TRAYS/PACK) ×1 IMPLANT
TROCAR 11X100 Z THREAD (TROCAR) ×1 IMPLANT
TROCAR BALLN 12MMX100 BLUNT (TROCAR) IMPLANT
TROCAR XCEL NON-BLD 5MMX100MML (ENDOMECHANICALS) ×1 IMPLANT
WARMER LAPAROSCOPE (MISCELLANEOUS) ×1 IMPLANT

## 2023-04-27 NOTE — Transfer of Care (Signed)
Immediate Anesthesia Transfer of Care Note  Patient: Jamie Heath  Procedure(s) Performed: DIAGNOSTIC LAPAROSCOPY LEFT SALPINGECTOMY WITH REMOVAL OF ECTOPIC PREGNANCY (Left)  Patient Location: PACU  Anesthesia Type:General  Level of Consciousness: awake, alert , and oriented  Airway & Oxygen Therapy: Patient Spontanous Breathing and Patient connected to face mask oxygen  Post-op Assessment: Report given to RN, Post -op Vital signs reviewed and stable, and Patient moving all extremities  Post vital signs: Reviewed and stable  Last Vitals:  Vitals Value Taken Time  BP 124/87 04/27/23 1238  Temp 36.6 C 04/27/23 1238  Pulse 89 04/27/23 1243  Resp 14 04/27/23 1243  SpO2 100 % 04/27/23 1243  Vitals shown include unvalidated device data.  Last Pain:  Vitals:   04/27/23 0800  TempSrc:   PainSc: 0-No pain         Complications: No notable events documented.

## 2023-04-27 NOTE — Anesthesia Preprocedure Evaluation (Addendum)
Anesthesia Evaluation  Patient identified by MRN, date of birth, ID band Patient awake    Reviewed: Allergy & Precautions, NPO status , Patient's Chart, lab work & pertinent test results  Airway Mallampati: II  TM Distance: <3 FB Neck ROM: Full    Dental  (+) Teeth Intact, Dental Advisory Given   Pulmonary neg pulmonary ROS   breath sounds clear to auscultation       Cardiovascular negative cardio ROS  Rhythm:Regular Rate:Normal     Neuro/Psych negative neurological ROS  negative psych ROS   GI/Hepatic negative GI ROS, Neg liver ROS,,,  Endo/Other  negative endocrine ROS    Renal/GU negative Renal ROS     Musculoskeletal   Abdominal   Peds  Hematology  (+) HIV  Anesthesia Other Findings   Reproductive/Obstetrics                             Anesthesia Physical Anesthesia Plan  ASA: 2  Anesthesia Plan: General   Post-op Pain Management:    Induction: Rapid sequence and Cricoid pressure planned  PONV Risk Score and Plan: 4 or greater and Ondansetron, Dexamethasone, Midazolam and Scopolamine patch - Pre-op  Airway Management Planned: Oral ETT  Additional Equipment: None  Intra-op Plan:   Post-operative Plan: Extubation in OR  Informed Consent: I have reviewed the patients History and Physical, chart, labs and discussed the procedure including the risks, benefits and alternatives for the proposed anesthesia with the patient or authorized representative who has indicated his/her understanding and acceptance.     Dental advisory given and Interpreter used for interveiw  Plan Discussed with: CRNA  Anesthesia Plan Comments:        Anesthesia Quick Evaluation

## 2023-04-27 NOTE — Anesthesia Procedure Notes (Signed)
Procedure Name: Intubation Date/Time: 04/27/2023 11:10 AM  Performed by: Colon Flattery, CRNAPre-anesthesia Checklist: Patient identified, Emergency Drugs available, Suction available and Patient being monitored Patient Re-evaluated:Patient Re-evaluated prior to induction Oxygen Delivery Method: Circle system utilized Preoxygenation: Pre-oxygenation with 100% oxygen Induction Type: IV induction Ventilation: Mask ventilation without difficulty Laryngoscope Size: Mac and 4 Grade View: Grade I Tube type: Oral Tube size: 7.0 mm Number of attempts: 1 Airway Equipment and Method: Stylet and Oral airway Placement Confirmation: ETT inserted through vocal cords under direct vision, positive ETCO2 and breath sounds checked- equal and bilateral Secured at: 21 cm Tube secured with: Tape Dental Injury: Teeth and Oropharynx as per pre-operative assessment

## 2023-04-27 NOTE — Anesthesia Postprocedure Evaluation (Signed)
Anesthesia Post Note  Patient: Jamie Heath  Procedure(s) Performed: DIAGNOSTIC LAPAROSCOPY LEFT SALPINGECTOMY WITH REMOVAL OF ECTOPIC PREGNANCY (Left: Abdomen)     Patient location during evaluation: PACU Anesthesia Type: General Level of consciousness: awake and alert Pain management: pain level controlled Vital Signs Assessment: post-procedure vital signs reviewed and stable Respiratory status: spontaneous breathing, nonlabored ventilation, respiratory function stable and patient connected to nasal cannula oxygen Cardiovascular status: blood pressure returned to baseline and stable Postop Assessment: no apparent nausea or vomiting Anesthetic complications: no  No notable events documented.  Last Vitals:  Vitals:   04/27/23 1315 04/27/23 1335  BP: 105/77 114/64  Pulse: 74 85  Resp: 17 17  Temp: (!) 36.4 C   SpO2: 97%     Last Pain:  Vitals:   04/27/23 1335  TempSrc:   PainSc: 4                  Shelton Silvas

## 2023-04-27 NOTE — Progress Notes (Signed)
FACULTY PRACTICE COMPREHENSIVE PROGRESS NOTE  Jamie Heath is a 32 y.o. G1P0000 at [redacted]w[redacted]d who is admitted for ectopic pregnancy.  Estimated Date of Delivery: 12/06/23  Length of Stay:  0 Days. Admitted 04/26/2023  Subjective: Doing well this morning with no complaints.  Vitals:  Blood pressure 112/68, pulse 71, temperature 98.2 F (36.8 C), temperature source Oral, resp. rate 17, height 5\' 3"  (1.6 m), weight 119.2 kg, last menstrual period 03/01/2023, SpO2 98 %. Physical Examination: CONSTITUTIONAL: Well-developed, well-nourished female in no acute distress.  NEUROLOGIC: Alert and oriented to person, place, and time.  CARDIOVASCULAR: Normal heart rate noted RESPIRATORY: Effort normal, no problems with respiration noted ABDOMEN: Soft, nontender, nondistended. Obese  Results for orders placed or performed during the hospital encounter of 04/26/23 (from the past 48 hour(s))  CBC with Differential/Platelet     Status: Abnormal   Collection Time: 04/26/23  9:09 PM  Result Value Ref Range   WBC 9.2 4.0 - 10.5 K/uL   RBC 5.26 (H) 3.87 - 5.11 MIL/uL   Hemoglobin 13.4 12.0 - 15.0 g/dL   HCT 16.1 09.6 - 04.5 %   MCV 80.0 80.0 - 100.0 fL   MCH 25.5 (L) 26.0 - 34.0 pg   MCHC 31.8 30.0 - 36.0 g/dL   RDW 40.9 81.1 - 91.4 %   Platelets 242 150 - 400 K/uL   nRBC 0.0 0.0 - 0.2 %   Neutrophils Relative % 59 %   Neutro Abs 5.5 1.7 - 7.7 K/uL   Lymphocytes Relative 33 %   Lymphs Abs 3.0 0.7 - 4.0 K/uL   Monocytes Relative 7 %   Monocytes Absolute 0.6 0.1 - 1.0 K/uL   Eosinophils Relative 0 %   Eosinophils Absolute 0.0 0.0 - 0.5 K/uL   Basophils Relative 0 %   Basophils Absolute 0.0 0.0 - 0.1 K/uL   Immature Granulocytes 1 %   Abs Immature Granulocytes 0.05 0.00 - 0.07 K/uL    Comment: Performed at [facility], [address], [place], [place] [address]  Comprehensive metabolic panel     Status: Abnormal   Collection Time: 04/26/23  9:09 PM  Result Value Ref Range   Sodium 138  135 - 145 mmol/L   Potassium 3.7 3.5 - 5.1 mmol/L   Chloride 105 98 - 111 mmol/L   CO2 24 22 - 32 mmol/L   Glucose, Bld 117 (H) 70 - 99 mg/dL    Comment: Glucose reference range applies only to samples taken after fasting for at least 8 hours.   BUN 10 6 - 20 mg/dL   Creatinine, Ser 6.21 0.44 - 1.00 mg/dL   Calcium 9.1 8.9 - 30.8 mg/dL   Total Protein 7.4 6.5 - 8.1 g/dL   Albumin 3.8 3.5 - 5.0 g/dL   AST 53 (H) 15 - 41 U/L   ALT 86 (H) 0 - 44 U/L   Alkaline Phosphatase 45 38 - 126 U/L   Total Bilirubin 0.4 0.3 - 1.2 mg/dL   GFR, Estimated >65 >78 mL/min    Comment: (NOTE) Calculated using the CKD-EPI Creatinine Equation (2021)    Anion gap 9 5 - 15    Comment: Performed at [facility], [address] [place], [place] [address]  hCG, quantitative, pregnancy     Status: Abnormal   Collection Time: 04/26/23  9:09 PM  Result Value Ref Range   hCG, Beta Chain, Quant, S 50 (H) <5 mIU/mL    Comment:  GEST. AGE      CONC.  (mIU/mL)   <=1 WEEK        5 - 50     2 WEEKS       50 - 500     3 WEEKS       100 - 10,000     4 WEEKS     1,000 - 30,000     5 WEEKS     3,500 - 115,000   6-8 WEEKS     12,000 - 270,000    12 WEEKS     15,000 - 220,000        FEMALE AND NON-PREGNANT FEMALE:     LESS THAN 5 mIU/mL Performed at [facility], [address] [place], [place] [address]   Type and screen [facility]     Status: None (Preliminary result)   Collection Time: 04/27/23 12:18 AM  Result Value Ref Range   ABO/RH(D) B POS    Antibody Screen POS    Sample Expiration 04/30/2023,2359    Antibody Identification ANTI I    Unit Number [id number]    Blood Component Type RED CELLS,LR    Unit division 00    Status of Unit ALLOCATED    Transfusion Status OK TO TRANSFUSE    Crossmatch Result COMPATIBLE    Unit Number [id number]    Blood Component Type RED CELLS,LR    Unit division 00    Status of Unit ALLOCATED    Transfusion Status OK TO  TRANSFUSE    Crossmatch Result COMPATIBLE   CBC     Status: Abnormal   Collection Time: 04/27/23  4:27 AM  Result Value Ref Range   WBC 8.5 4.0 - 10.5 K/uL   RBC 5.00 3.87 - 5.11 MIL/uL   Hemoglobin 12.7 12.0 - 15.0 g/dL   HCT 24.4 01.0 - 27.2 %   MCV 80.2 80.0 - 100.0 fL   MCH 25.4 (L) 26.0 - 34.0 pg   MCHC 31.7 30.0 - 36.0 g/dL   RDW 53.6 64.4 - 03.4 %   Platelets 221 150 - 400 K/uL   nRBC 0.0 0.0 - 0.2 %    Comment: Performed at [facility], [address] [place], [place] [address]    [place] Transvaginal  Result Date: 04/26/2023 CLINICAL DATA:  Abdominal pain affecting pregnancy. Abnormal beta HCG. EXAM: TRANSVAGINAL OB ULTRASOUND TECHNIQUE: Transvaginal ultrasound was performed for complete evaluation of the gestation as well as the maternal uterus, adnexal regions, and pelvic cul-de-sac. COMPARISON:  None Available. FINDINGS: Intrauterine gestational sac: None Yolk sac:  Not Visualized. Embryo:  Not Visualized. Cardiac Activity: Not Visualized. Maternal uterus/adnexae: Anteverted uterus. Endometrium measures 6 mm. No intrauterine gestational sac. Left adnexal mass adjacent to the ovary measures 1.5 x 1.1 x 2.3 cm. The ovary appears separate. Right ovary is not definitively seen. Trace free fluid in the pelvis, appearing simple. IMPRESSION: Findings highly suspicious for left adnexal ectopic pregnancy measuring 2.3 cm. No significant free fluid in the pelvis. Critical Value/emergent results were called by telephone at the time of interpretation on 04/26/2023 at 10:20 pm to provider Thalia Bloodgood , who verbally acknowledged these results. Electronically Signed   By: Narda Rutherford M.D.   On: 04/26/2023 22:21    Current scheduled medications  ketorolac  15 mg Intravenous On Call to OR   sodium chloride flush  3 mL Intravenous Q12H   sodium citrate-citric acid  30 mL Oral On Call  to OR    I have reviewed the patient's current medications.  ASSESSMENT: Principal Problem:    Ectopic pregnancy Active Problems:   HIV (human immunodeficiency virus infection) (HCC) Reviewed diagnosis of ectopic pregnancy and recommendation for surgical management. Methotrexate is contraindicated due to abnormal LFTs.  We reviewed the anticipated surgical procedure. Dicsussed risks of surgery include but are not limited to: bleeding, infection, injury to surrounding organs/tissues (i.e. bowel/bladder/ureters), need for additional procedures, wound complications, hospital re-admission, and conversion to open surgery, VTE. We discussed postop restrictions, precautions and expectations  PLAN: To OR for diagnostic laparoscopy, unilateral salpingectomy, additional procedures as indicated No pre op abx indicated Anticipate same day discharge after surgical procedure.  Harvie Bridge, MD Obstetrician & Gynecologist, [facility] for [employer], [facility]

## 2023-04-27 NOTE — Discharge Summary (Signed)
Gynecology Physician Postoperative Discharge Summary  Patient ID: Jamie Heath MRN: [id number] DOB/AGE: 34-Aug-1992 34 y.o.  Admit Date: 04/26/2023 Discharge Date: 04/28/2023  Preoperative Diagnoses:  Ectopic pregnancy  Procedures: Procedure(s) (LRB): DIAGNOSTIC LAPAROSCOPY LEFT SALPINGECTOMY WITH REMOVAL OF ECTOPIC PREGNANCY (Left)  Hospital Course:  Jamie Heath is a 32 y.o. G1P0000 who presented for follow up of pregnancy of unknown location versus ectopic pregnancy. She had an abnormal BhCG trend and pelvic ultrasound revealed a 2.3 x 1.1 x 1.5cm left adnexal mass concerning for ectopic pregnancy. The patient was not a candidate for management with methotrexate due to a mild transaminitis. She was admitted overnight with plan for scheduled surgery.   The patient underwent an uncomplicated laparoscopic left salpingectomy for left tubal ectopic pregnancy. A small amount of hemoperitoneum was noted potentially related to tubal abortion, but no active bleeding seen at time of surgery. For further details about surgery, please refer to the operative report. Patient had an uncomplicated postoperative course. By time of discharge on POD#1, her pain was controlled, she was ambulating, voiding without difficulty, tolerating regular diet, and passing flatus. She was deemed stable for discharge to home.   Significant Labs:    Latest Ref Rng & Units 04/27/2023   10:41 PM 04/27/2023    4:27 AM 04/26/2023    9:09 PM  CBC  WBC 4.0 - 10.5 K/uL 9.7  8.5  9.2   Hemoglobin 12.0 - 15.0 g/dL 78.2  95.6  21.3   Hematocrit 36.0 - 46.0 % 38.5  40.1  42.1   Platelets 150 - 400 K/uL 224  221  242    Discharge Exam: Blood pressure 109/61, pulse 72, temperature 97.6 F (36.4 C), temperature source Oral, resp. rate 18, height 5' 2.99" (1.6 m), weight 119.2 kg, last menstrual period 03/01/2023, SpO2 98 %. General appearance: alert and no distress  Resp: normal respiratory effort Cardio: regular rate   GI: soft, nondistended, nontender  Incision: umbilical incision with honeycomb in place, bruising inferior to incision but clean, dry & intact. LLQ and RLQ incisions c/d/I with surgical glue in place Extremities: extremities normal, no sign of DVT  Discharged Condition: Stable  Disposition: Discharge disposition: 01-Home or Self Care      Discharge Instructions     Call MD for:  persistant nausea and vomiting   Complete by: As directed    Call MD for:  redness, tenderness, or signs of infection (pain, swelling, redness, odor or green/yellow discharge around incision site)   Complete by: As directed    Call MD for:  severe uncontrolled pain   Complete by: As directed    Call MD for:  temperature >100.4   Complete by: As directed    Diet general   Complete by: As directed    Driving Restrictions   Complete by: As directed    Do not drive while taking oxycodone   Increase activity slowly   Complete by: As directed    Lifting restrictions   Complete by: As directed    Do not lift more than 15lbs until cleared by your surgeon   Other Restrictions   Complete by: As directed    Do not soak your incisions until cleared by your surgeon. No tub baths, pools, hot tubs, or swimming. You may shower.  Do not expose your incisions to sunlight.   Sexual Activity Restrictions   Complete by: As directed    Avoid sexual activity until cleared by your surgeon  Allergies as of 04/28/2023   No Known Allergies      Medication List     TAKE these medications    ibuprofen 600 MG tablet Commonly known as: ADVIL Take 1 tablet (600 mg total) by mouth every 6 (six) hours.   ondansetron 4 MG disintegrating tablet Commonly known as: ZOFRAN-ODT Take 1 tablet (4 mg total) by mouth every 6 (six) hours as needed for nausea.   oxyCODONE 5 MG immediate release tablet Commonly known as: Roxicodone Take 1 tablet (5 mg total) by mouth every 4 (four) hours as needed for breakthrough pain.    senna-docusate 8.6-50 MG tablet Commonly known as: Senokot-S Take 1 tablet by mouth at bedtime.        Future Appointments  Date Time Provider Department Center  05/07/2023  3:35 PM [name], MD [facility]    Follow-up Information     [facility] Follow up in 3 week(s).   Specialty: Obstetrics and Gynecology Why: Our office will call to help you schedule an appointment in 2-4 weeks Contact information: [address] [place] [address] [phone]                Total discharge time: 30 minutes   Signed: Harvie Bridge, MD Obstetrician & Gynecologist, [facility] for [facility], [facility]

## 2023-04-27 NOTE — Op Note (Signed)
Jamie Heath PROCEDURE DATE: 04/27/2023  PREOPERATIVE DIAGNOSIS: Ectopic pregnancy POSTOPERATIVE DIAGNOSIS: left fallopian tube ectopic pregnancy PROCEDURE: Laparoscopic left salpingectomy and removal of ectopic pregnancy SURGEON:  Dr. Harvie Bridge ASSISTANT: Dr. Leroy Libman. An experienced assistant was required given the standard of surgical care given the complexity of the case.  This assistant was needed for exposure, dissection, suctioning, retraction, instrument exchange, and for overall help during the procedure. ANESTHESIOLOGY TEAM: Anesthesiologist: Shelton Silvas, MD CRNA: Colon Flattery, CRNA  INDICATIONS: 32 y.o. G1P0000 at [redacted]w[redacted]d here with the preoperative diagnoses as listed above.  Please refer to preoperative notes for more details. Patient was counseled regarding need for laparoscopic salpingectomy. Risks of surgery including bleeding which may require transfusion or reoperation, infection, injury to bowel or other surrounding organs, need for additional procedures including laparotomy and other postoperative/anesthesia complications were explained to patient.  Written informed consent was obtained.  FINDINGS:  Small amount of hemoperitoneum estimated to be about 50cc of blood and clots. Dilated left fallopian tube containing ectopic gestation. 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 Filmy adhesive disease between the left and right fallopian tubes as well as the ovaries and their respective tubes. Otherwise normal right fallopian tube and ovaries.  ANESTHESIA: General INTRAVENOUS FLUIDS: 800 ml ESTIMATED BLOOD LOSS: 20 ml URINE OUTPUT: 200 ml SPECIMENS: Left fallopian tube containing ectopic gestation COMPLICATIONS: None immediate  PROCEDURE IN DETAIL:  The patient was taken to the operating room where general anesthesia was administered and was found to be adequate.  She was placed in the dorsal lithotomy position, and was prepped and  draped in a sterile manner.  A Foley catheter was inserted into her bladder and attached to constant drainage and a uterine manipulator was then advanced into the uterus .    She was prepped and draped in the usual sterile fashion in the dorsal lithotomy position. In Hasson fashion, an infraumbilical incision was made, fascia identified with blunt dissection and divided sharply. Fascial edges were tagged with 0-Vicryl. The peritoneum was grasped, elevated and sharply entered. Hasson trocar placed. The laparoscope was inserted and after confirming intraperitoneal entry, the abdomen was insufflated. Below the point of entry was carefully inspected and there was no evidence of injury. Small volume hemoperitoneum noted in the pelvis. She was placed in steep Trendelenburg.  The scalpel was then used to make two 5mm incisions, one in the RLQ and one in the LLQ.  Two 5mm ports were inserted under direct visualization.   Attention was turned to the pelvis. The ligasure device was used to take down filmy adhesive disease between the fallopian tubes as well as filmy adhesions between the tubes and their respective ovaries. Once the tubes were adequately freed, we were able to see that the left tube was dilated with an ectopic pregnancy at the disteal end. The left fallopian tube was grasped and ligated from the underlying mesosalpinx and uterine attachment using the Ligasure. Good hemostasis was noted.  The specimen was placed in an EndoCatch bag and removed from the abdomen intact. The  suction irrigator was then used to suction the hemoperitoneum and irrigate the pelvis. Excellent hemostasis noted on all surfaces. The 5mm ports were removed under direct visualization. The abdomen was desufflated, and all instruments were removed. The umbilical incision was closed with the initial tags of 0-vicryl as well as 2 additional simple interrupted sutures of 0-vicryl.    The incisions were then closed in a subcuticular fashion  with 4-0 monocryl  and dressings were placed. She was taken to recovery in stable condition.  Rh status: positive   The patient will be discharged to home as per PACU criteria.  Routine postoperative instructions given.  She was prescribed oxycodone, ibuprofen, and miralax.    She will follow up in the office in about 2-3 weeks for postoperative evaluation.  Harvie Bridge, MD Obstetrician & Gynecologist, [facility] for [employer], [facility]

## 2023-04-28 ENCOUNTER — Encounter (HOSPITAL_COMMUNITY): Payer: Self-pay | Admitting: Obstetrics and Gynecology

## 2023-04-28 NOTE — Discharge Instructions (Signed)
Please remove the dressing over your belly button by Wednesday

## 2023-04-29 LAB — TYPE AND SCREEN

## 2023-04-29 LAB — BPAM RBC: Blood Product Expiration Date: 202407032359

## 2023-04-30 LAB — SURGICAL PATHOLOGY

## 2023-05-01 LAB — BPAM RBC
Blood Product Expiration Date: 202407032359
Unit Type and Rh: 7300

## 2023-05-01 LAB — TYPE AND SCREEN
Antibody Screen: POSITIVE
Unit division: 0

## 2023-05-07 ENCOUNTER — Other Ambulatory Visit: Payer: Self-pay | Admitting: Obstetrics and Gynecology

## 2023-05-07 ENCOUNTER — Encounter: Payer: Self-pay | Admitting: Obstetrics and Gynecology

## 2023-05-07 ENCOUNTER — Ambulatory Visit (INDEPENDENT_AMBULATORY_CARE_PROVIDER_SITE_OTHER): Payer: Self-pay | Admitting: Obstetrics and Gynecology

## 2023-05-07 ENCOUNTER — Other Ambulatory Visit: Payer: Self-pay

## 2023-05-07 VITALS — BP 112/77 | HR 75 | Ht 62.4 in | Wt 259.9 lb

## 2023-05-07 DIAGNOSIS — Z3A01 Less than 8 weeks gestation of pregnancy: Secondary | ICD-10-CM

## 2023-05-07 DIAGNOSIS — Z3169 Encounter for other general counseling and advice on procreation: Secondary | ICD-10-CM

## 2023-05-07 DIAGNOSIS — Z758 Other problems related to medical facilities and other health care: Secondary | ICD-10-CM

## 2023-05-07 DIAGNOSIS — Z603 Acculturation difficulty: Secondary | ICD-10-CM

## 2023-05-07 DIAGNOSIS — O00102 Left tubal pregnancy without intrauterine pregnancy: Secondary | ICD-10-CM

## 2023-05-07 NOTE — Progress Notes (Unsigned)
GYNECOLOGY VISIT  Patient name: Scottlynn Nusser MRN [id number]  Date of birth: 08/09/1991 Chief Complaint:   Follow-up   History:  Saran Mannarino is a 32 y.o. G1P0010 being seen today for postop s/p lsc salpingectomy for ectopic. Doing well , regular pains of surgery. Takign ibuprofen for pain at this time.  Has been having BM but having some intermittent constipation  Yesterday had upper abdominal pain after having spicy; not reflux, pain in upper abdomen  No concerns with her incision Possible gastritis from continual nsaid [place] e- recommend alternate with tylenol and see if that helps  8 years of trying to concieve; partner does not have children  Spontaneous concpetio n  Past Medical History:  Diagnosis Date   Breast discharge    HIV (human immunodeficiency virus infection) (HCC)     Past Surgical History:  Procedure Laterality Date   DIAGNOSTIC LAPAROSCOPY WITH REMOVAL OF ECTOPIC PREGNANCY Left 04/27/2023   Procedure: DIAGNOSTIC LAPAROSCOPY LEFT SALPINGECTOMY WITH REMOVAL OF ECTOPIC PREGNANCY;  Surgeon: Lennart Pall, MD;  Location: [facility] OR;  Service: Gynecology;  Laterality: Left;   IRRIGATION AND DEBRIDEMENT ABSCESS Right 09/30/2015   Procedure: IRRIGATION AND DEBRIDEMENT FLANK ABSCESS;  Surgeon: Rodman Pickle, MD;  Location: [facility] OR;  Service: General;  Laterality: Right;    The following portions of the patient's history were reviewed and updated as appropriate: allergies, current medications, past family history, past medical history, past social history, past surgical history and problem list.   Health Maintenance:   Last pap 07/2022 NILM HPV negative Last mammogram: n/a   Review of Systems:  Pertinent items are noted in HPI. Comprehensive review of systems was otherwise negative.   Objective:  Physical Exam BP 112/77 (BP Location: Left Arm)   Pulse 75   Ht 5' 2.4" (1.585 m)   Wt 259 lb 14.4 oz (117.9 kg)   LMP 03/01/2023  (Exact Date)   BMI 46.93 kg/m    Physical Exam   Labs and Imaging US OB Transvaginal  Result Date: 04/26/2023 CLINICAL DATA:  Abdominal pain affecting pregnancy. Abnormal beta HCG. EXAM: TRANSVAGINAL OB ULTRASOUND TECHNIQUE: Transvaginal ultrasound was performed for complete evaluation of the gestation as well as the maternal uterus, adnexal regions, and pelvic cul-de-sac. COMPARISON:  None Available. FINDINGS: Intrauterine gestational sac: None Yolk sac:  Not Visualized. Embryo:  Not Visualized. Cardiac Activity: Not Visualized. Maternal uterus/adnexae: Anteverted uterus. Endometrium measures 6 mm. No intrauterine gestational sac. Left adnexal mass adjacent to the ovary measures 1.5 x 1.1 x 2.3 cm. The ovary appears separate. Right ovary is not definitively seen. Trace free fluid in the pelvis, appearing simple. IMPRESSION: Findings highly suspicious for left adnexal ectopic pregnancy measuring 2.3 cm. No significant free fluid in the pelvis. Critical Value/emergent results were called by telephone at the time of interpretation on 04/26/2023 at 10:20 pm to provider Thalia Bloodgood , who verbally acknowledged these results. Electronically Signed   By: Narda Rutherford M.D.   On: 04/26/2023 22:21       Assessment & Plan:   1. Left tubal pregnancy without intrauterine pregnancy Doing well postoperatively. Reviewed pathology and able to conceive with singular tube in most cases.   2. Infertility counseling Patient reports this was first pregnancy after 8 years of attempting to conceive. TSH and AMH today and return to further discuss infertility.  - TSH Rfx on Abnormal to Free T4 - Anti mullerian hormone  3. Language barrier Video Spanish Interpreter used for encounter  Routine  preventative health maintenance measures emphasized.  Darliss Cheney, MD [facility] for [place]

## 2023-05-12 LAB — TSH RFX ON ABNORMAL TO FREE T4: TSH: 2.19 u[IU]/mL (ref 0.450–4.500)

## 2023-05-12 LAB — ANTI MULLERIAN HORMONE: ANTI-MULLERIAN HORMONE (AMH): 0.505 ng/mL

## 2023-05-13 DIAGNOSIS — E119 Type 2 diabetes mellitus without complications: Principal | ICD-10-CM

## 2023-05-13 DIAGNOSIS — B20 Human immunodeficiency virus [HIV] disease: Principal | ICD-10-CM

## 2023-05-16 MED ORDER — METFORMIN 500 MG TABLET
ORAL_TABLET | Freq: Two times a day (BID) | ORAL | 9 refills | 30 days | Status: CP
Start: 2023-05-16 — End: ?

## 2023-05-17 ENCOUNTER — Other Ambulatory Visit: Payer: Self-pay | Admitting: Obstetrics and Gynecology

## 2023-05-20 ENCOUNTER — Ambulatory Visit: Admit: 2023-05-20 | Discharge: 2023-05-21 | Attending: Obstetrics & Gynecology | Primary: Obstetrics & Gynecology

## 2023-05-20 DIAGNOSIS — B20 Human immunodeficiency virus [HIV] disease: Principal | ICD-10-CM

## 2023-05-20 DIAGNOSIS — Z3A01 Less than 8 weeks gestation of pregnancy: Principal | ICD-10-CM

## 2023-05-29 ENCOUNTER — Encounter: Payer: Self-pay | Admitting: Obstetrics and Gynecology

## 2023-05-29 ENCOUNTER — Ambulatory Visit (INDEPENDENT_AMBULATORY_CARE_PROVIDER_SITE_OTHER): Payer: Self-pay | Admitting: Obstetrics and Gynecology

## 2023-05-29 ENCOUNTER — Emergency Department (HOSPITAL_COMMUNITY): Payer: PRIVATE HEALTH INSURANCE

## 2023-05-29 ENCOUNTER — Other Ambulatory Visit: Payer: Self-pay

## 2023-05-29 ENCOUNTER — Emergency Department (HOSPITAL_COMMUNITY)
Admission: EM | Admit: 2023-05-29 | Discharge: 2023-05-29 | Disposition: A | Discharge status: Home / Self Care | Payer: PRIVATE HEALTH INSURANCE | Attending: Emergency Medicine | Admitting: Emergency Medicine

## 2023-05-29 VITALS — BP 134/86 | HR 94 | Ht 62.0 in | Wt 263.0 lb

## 2023-05-29 DIAGNOSIS — N971 Female infertility of tubal origin: Secondary | ICD-10-CM

## 2023-05-29 DIAGNOSIS — G510 Bell's palsy: Secondary | ICD-10-CM

## 2023-05-29 DIAGNOSIS — Z09 Encounter for follow-up examination after completed treatment for conditions other than malignant neoplasm: Secondary | ICD-10-CM

## 2023-05-29 LAB — BASIC METABOLIC PANEL
Anion gap: 10 (ref 5–15)
BUN: 9 mg/dL (ref 6–20)
CO2: 21 mmol/L — ABNORMAL LOW (ref 22–32)
Calcium: 9.2 mg/dL (ref 8.9–10.3)
Chloride: 107 mmol/L (ref 98–111)
Creatinine, Ser: 0.74 mg/dL (ref 0.44–1.00)
GFR, Estimated: >60 mL/min (ref >60)
Glucose, Bld: 126 mg/dL — ABNORMAL HIGH (ref 70–99)
Potassium: 3.7 mmol/L (ref 3.5–5.1)
Sodium: 138 mmol/L (ref 135–145)

## 2023-05-29 LAB — CBC
HCT: 45.4 % (ref 36.0–46.0)
Hemoglobin: 14.4 g/dL (ref 12.0–15.0)
MCH: 26.1 pg (ref 26.0–34.0)
MCHC: 31.7 g/dL (ref 30.0–36.0)
MCV: 82.2 fL (ref 80.0–100.0)
Platelets: 279 10*3/uL (ref 150–400)
RBC: 5.52 MIL/uL — ABNORMAL HIGH (ref 3.87–5.11)
RDW: 14 % (ref 11.5–15.5)
WBC: 9.4 10*3/uL (ref 4.0–10.5)
nRBC: 0 % (ref 0.0–0.2)

## 2023-05-29 MED ORDER — PREDNISONE 20 MG PO TABS: 60.0000 mg | ORAL_TABLET | Freq: Every day | ORAL | 0 refills | Status: AC

## 2023-05-29 MED ORDER — AMOXICILLIN-POT CLAVULANATE 875-125 MG PO TABS: 1.0000 | ORAL_TABLET | Freq: Two times a day (BID) | ORAL | 0 refills | Status: AC

## 2023-05-29 MED ORDER — VALACYCLOVIR HCL 1 G PO TABS: 1000.0000 mg | ORAL_TABLET | Freq: Three times a day (TID) | ORAL | 0 refills | Status: AC

## 2023-05-29 NOTE — ED Provider Triage Note (Addendum)
Emergency Medicine Provider Triage Evaluation Note  Jamie Heath , a 32 y.o. female  was evaluated in triage. H/o HIV.  Pt complains of facial paralysis. Started yesterday. Located in left side of face. Forehead is involved.Also states a mild headache.  Also mentioned that from time to time, her upper left arm can feel numb. This also started yesterday.   Review of Systems  Positive: See above Negative: See above  Physical Exam  BP 126/80 (BP Location: Right Arm)   Pulse 78   Temp 98.5 F (36.9 C) (Oral)   Resp 15   LMP 03/01/2023 (Exact Date)   SpO2 96%  Gen:   Awake, no distress   Resp:  Normal effort  MSK:   Moves extremities without difficulty  Other:  Left sided non sparing forehead facial droop noted  Medical Decision Making  Medically screening exam initiated at 6:42 PM.  Appropriate orders placed.  Dane Bloch Heath was informed that the remainder of the evaluation will be completed by another provider, this initial triage assessment does not replace that evaluation, and the importance of remaining in the ED until their evaluation is complete.  Work up started    Gareth Eagle, [place] 05/29/23 1847

## 2023-05-29 NOTE — Discharge Instructions (Signed)
Tape your eye shut at night when you go to sleep.  By the preservative-free eyedrops and you can use a ampule throughout the day but she have to throw it away at the end of the day.  Please follow-up with the neurologist and eye doctor in the office.  The neurologist should give you a call to set up an appointment, you need to call the eye doctor to set up an appointment.  I would call the eye doctor tomorrow.  Please return for one-sided numbness or weakness or difficulty with speech or swallowing.  Your CT scan did show that you might have a sinus infection so started you on antibiotics.

## 2023-05-29 NOTE — ED Provider Notes (Signed)
[place] EMERGENCY DEPARTMENT AT [facility] Provider Note   CSN: [id number] Arrival date & time: 05/29/23  1727     History  Chief Complaint  Patient presents with   Numbness    Jamie Heath is a 32 y.o. female.  32 yo F with a chief complaints of left-sided facial droop.  This been going on for couple days.  She saw her OB/GYN who evaluated her and diagnosed her with Bell's palsy.  She was instructed to call her primary care provider today.  She called them and they told her to come immediately to the ER.  They never evaluated her in person.  She denied one-sided numbness or weakness otherwise denied difficulty with speech or swallowing.  Denies pain to the face denies pain to the eye.  She was started on prednisone by her OB/GYN.        Home Medications Prior to Admission medications   Medication Sig Start Date End Date Taking? Authorizing Provider  amoxicillin-clavulanate (AUGMENTIN) 875-125 MG tablet Take 1 tablet by mouth every 12 (twelve) hours. 05/29/23  Yes Melene Plan, DO  valACYclovir (VALTREX) 1000 MG tablet Take 1 tablet (1,000 mg total) by mouth 3 (three) times daily. 05/29/23  Yes Melene Plan, DO  ibuprofen (ADVIL) 600 MG tablet Take 1 tablet (600 mg total) by mouth every 6 (six) hours. 04/27/23   Lennart Pall, MD  ondansetron (ZOFRAN-ODT) 4 MG disintegrating tablet Take 1 tablet (4 mg total) by mouth every 6 (six) hours as needed for nausea. Patient not taking: Reported on 05/07/2023 04/27/23   Lennart Pall, MD  oxyCODONE (ROXICODONE) 5 MG immediate release tablet Take 1 tablet (5 mg total) by mouth every 4 (four) hours as needed for breakthrough pain. Patient not taking: Reported on 05/07/2023 04/27/23   Lennart Pall, MD  predniSONE (DELTASONE) 20 MG tablet Take 3 tablets (60 mg total) by mouth daily with breakfast for 7 days. 05/29/23 06/05/23  Lennart Pall, MD  senna-docusate (SENOKOT-S) 8.6-50 MG tablet Take 1 tablet by  mouth at bedtime. Patient not taking: Reported on 05/07/2023 04/27/23   Lennart Pall, MD      Allergies    Patient has no known allergies.    Review of Systems   Review of Systems  Physical Exam Updated Vital Signs BP (!) 108/57 (BP Location: Right Arm)   Pulse 89   Temp 98.4 F (36.9 C) (Oral)   Resp 17   Ht 5\' 2"  (1.575 m)   Wt 117.9 kg   LMP 03/01/2023 (Exact Date)   SpO2 100%   BMI 47.55 kg/m  Physical Exam Vitals and nursing note reviewed.  Constitutional:      General: She is not in acute distress.    Appearance: She is well-developed. She is not diaphoretic.  HENT:     Head: Normocephalic and atraumatic.  Eyes:     Pupils: Pupils are equal, round, and reactive to light.  Cardiovascular:     Rate and Rhythm: Normal rate and regular rhythm.     Heart sounds: No murmur heard.    No friction rub. No gallop.  Pulmonary:     Effort: Pulmonary effort is normal.     Breath sounds: No wheezing or rales.  Abdominal:     General: There is no distension.     Palpations: Abdomen is soft.     Tenderness: There is no abdominal tenderness.  Musculoskeletal:        General: No  tenderness.     Cervical back: Normal range of motion and neck supple.  Skin:    General: Skin is warm and dry.  Neurological:     Mental Status: She is alert and oriented to person, place, and time.     Comments: Left-sided facial droop including the forehead.  Has difficulty closing the left eye.  No obvious rash.  Left TM is normal.  Otherwise benign neurologic exam.  Psychiatric:        Behavior: Behavior normal.     ED Results / Procedures / Treatments   Labs (all labs ordered are listed, but only abnormal results are displayed) Labs Reviewed  CBC - Abnormal; Notable for the following components:      Result Value   RBC 5.52 (*)    All other components within normal limits  BASIC METABOLIC PANEL - Abnormal; Notable for the following components:   CO2 21 (*)    Glucose, Bld 126  (*)    All other components within normal limits    EKG None  Radiology CT Head Wo Contrast  Result Date: 05/29/2023 CLINICAL DATA:  Neurological deficit, suspected stroke EXAM: CT HEAD WITHOUT CONTRAST TECHNIQUE: Contiguous axial images were obtained from the base of the skull through the vertex without intravenous contrast. RADIATION DOSE REDUCTION: This exam was performed according to the departmental dose-optimization program which includes automated exposure control, adjustment of the mA and/or kV according to patient size and/or use of iterative reconstruction technique. COMPARISON:  None Available. FINDINGS: Brain: No acute intracranial findings are seen. There are no signs of bleeding within the cranium. Ventricles are not dilated. There is no focal edema or mass effect. Vascular: Unremarkable. Skull: No acute findings are seen. Sinuses/Orbits: There is air-fluid level in left maxillary sinus. Other: None. IMPRESSION: No acute intracranial findings are seen in noncontrast CT brain. Air-fluid level in left maxillary sinus may suggest left maxillary sinusitis. Electronically Signed   By: Ernie Avena M.D.   On: 05/29/2023 19:19    Procedures Procedures    Medications Ordered in ED Medications - No data to display  ED Course/ Medical Decision Making/ A&P                             Medical Decision Making Risk Prescription drug management.   32 yo F with a chief complaints of left-sided facial droop going on for couple days.  Had been seen by her OB/GYN and was diagnosed with Bell's palsy.  Then through what sounds like a communications where she was sent to the ER to be evaluated.  Clinically it is consistent with Bell's palsy.  I do not feel like she needs further imaging however she was seen in the triage process and had a CT scan that was ordered that does show the possibility of a sinus infection.  She does endorse some congestion.  Will do a course of antibiotics.  Start  on antiviral medications.  Have her continue her steroids prescribed by her GYN.  Will have her follow-up with both neurology and ophthalmology.  Basic blood work here with no significant electrolyte abnormalities, no significant anemia.  9:32 PM:  I have discussed the diagnosis/risks/treatment options with the patient.  Evaluation and diagnostic testing in the emergency department does not suggest an emergent condition requiring admission or immediate intervention beyond what has been performed at this time.  They will follow up with PCP. We also discussed returning to  the ED immediately if new or worsening sx occur. We discussed the sx which are most concerning (e.g., sudden worsening pain, fever, inability to tolerate by mouth) that necessitate immediate return. Medications administered to the patient during their visit and any new prescriptions provided to the patient are listed below.  Medications given during this visit Medications - No data to display   The patient appears reasonably screen and/or stabilized for discharge and I doubt any other medical condition or other [facility] requiring further screening, evaluation, or treatment in the ED at this time prior to discharge.          Final Clinical Impression(s) / ED Diagnoses Final diagnoses:  Bell's palsy    Rx / DC Orders ED Discharge Orders          Ordered    Ambulatory referral to Neurology       Comments: Bells palsy   05/29/23 2127    valACYclovir (VALTREX) 1000 MG tablet  3 times daily        05/29/23 2127    amoxicillin-clavulanate (AUGMENTIN) 875-125 MG tablet  Every 12 hours        05/29/23 2127              Melene Plan, DO 05/29/23 2132

## 2023-05-29 NOTE — Patient Instructions (Addendum)
Please schedule an appointment with your primary care doctor as soon as possible  Refresh eye drops

## 2023-05-29 NOTE — Progress Notes (Signed)
   RETURN GYNECOLOGY VISIT - POST OP  Subjective:  Jamie Heath is a 32 y.o. G1P0010 who is 4 weeks s/p dx lsc, L salpingectomy for tubal ectopic pregnancy presenting for post op appt  Doing well from post op standpoint. Incisions well healed without issue. No ongoing pain. Tolerating regular diet. No issues with bowel/bladder function.   Saw Dr. Briscoe Deutscher 6/18 - was doing well at that point. Reported that this was her first pregnancy after TTC x 8 years. TSH WNL, AMH low at 0.505. I personally reviewed her TSH from 05/07/23 and AMH from 05/07/23.   Separately, pt reports 2 days of left sided facial weakness with some tingling in her left arm/leg. No headaches. No UE/LE weakness. Reports flu like symptoms.  Objective:   Vitals:   05/29/23 1020  BP: 134/86  Pulse: 94  Weight: 263 lb (119.3 kg)  Height: 5\' 2"  (1.575 m)    General:  Alert, oriented and cooperative. Patient is in no acute distress.  Skin: Skin is warm and dry. No rash noted.   Cardiovascular: Normal heart rate noted  Respiratory: Normal respiratory effort, no problems with respiration noted  Abdomen: Soft, non-tender, non-distended. Incisions well healed.   Neuro: Able to close both eyes and resist attempts at provider to open eyes.  Unable to lift left eyebrow, has asymmetrical smile when spontaneously laughing. Limited ability to puff out left cheek. Equal sensation on face bilaterally.  Alert & oriented to person, place, time & situation. Conversant without aphasia. UE: Sensation intact to light touch bilaterally. 5/5 strength bilaterally at the deltoids, biceps & triceps. No pronator drift LE: Sensation intact to light touch bilaterally. 5/5 strength bilaterally at hip flexors, quadriceps & with plantar & dorsiflexion  Exam performed in the presence of a chaperone  Assessment and Plan:  Jamie Heath is a 32 y.o. presenting for postoperative appointment  Post op Can resume normal  activities without restrictions Reviewed benign path and intra op findings Discussed adhesive disease present on left tube that was removed at time of surgery  2. Bell's palsy Exam today c/w Bell's palsy. Forehead involved, no other weakness or deficit noted. Normal cognition, speech, etc.  Rx for steroids sent Discussed that pt should follow up with PCP for further evaluation and management Discussed role for artificial tears if experiencing dry eye, need for closer interval follow up if unable to close her eye Reviewed ED precautions if symptoms become associated with severe headache, fevers, difficulty with speech, weakness in the arms/legs -     predniSONE (DELTASONE) 20 MG tablet; Take 3 tablets (60 mg total) by mouth daily with breakfast for 7 days.  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 Discussed AMH < 1 is associated with poorer response to ovarian stimulation, but not live birth rates. We reviewed that infertility providers may recommend use of donor eggs if she were to pursue ART.  Pt reports that an HSG in planned. Given her scar tissue at time of surgery, agree with that evaluation at 8-12 weeks post op  Future Appointments  Date Time Provider Department Center  06/06/2023 10:55 AM [name], MD [facility]    Lennart Pall, MD

## 2023-05-29 NOTE — Progress Notes (Addendum)
32 y.o. GYN presents for Post Op Follow.  C/o tingling and numbness left side of face arms and fingers, feels like throat is closing x 2 days.  Denies tightness in chest, SOB.    Last PAP 07/27/22

## 2023-05-29 NOTE — ED Triage Notes (Signed)
BIB EMS from home.  Left sided facial "paralysis" since yesterday.  Had OB/GYN appt today and mentioned it to them who requested she come to the ED for eval.  Medical hx includes HIV for which she is compliant with treatment.  Left side of face remains unable to move.

## 2023-05-30 ENCOUNTER — Telehealth: Payer: Self-pay | Admitting: Emergency Medicine

## 2023-05-30 NOTE — Telephone Encounter (Signed)
RC to patient to answer questions about medication.

## 2023-06-04 ENCOUNTER — Encounter: Payer: Self-pay | Admitting: Neurology

## 2023-06-04 ENCOUNTER — Ambulatory Visit (INDEPENDENT_AMBULATORY_CARE_PROVIDER_SITE_OTHER): Payer: Self-pay | Admitting: Neurology

## 2023-06-04 VITALS — BP 122/84 | HR 72 | Ht 62.0 in | Wt 263.8 lb

## 2023-06-04 DIAGNOSIS — G510 Bell's palsy: Secondary | ICD-10-CM

## 2023-06-04 NOTE — Progress Notes (Signed)
[facility]  Provider:  Dr Esabella Stockinger Referring Provider: Melene Plan, DO Primary Care Physician:  Ethelda Chick, MD  Chief Complaint  Patient presents with   New Patient (Initial Visit)    Rm 1. Accompanied by interpreter.  Referral for Bell's palsy. She feels numbness on the left side of the face, it has improved some. She reports ear pain, especially with loud sounds. She has less sense of taste with her tongue on the left side. Ability to chew has improved.    HPI:  Jamie Heath is a 32 y.o. female patient of [place] descent and seen here upon referral from Dr. Adela Lank for a Consultation/ Evaluation of Bells Palsy.  The patient was with by Dr. Melene Plan, DO of the emergency medicine department who saw the patient in the emergency room on 05-29-2023.  The patient presented at about 5 PM with a left-sided facial droop this has been going on for at least 3 or 4 days she initially contacted her OB/GYN who diagnosed her with Bell's palsy and instructed her to call the primary care provider today when she called the primary care provider they told her to come to the emergency room they never evaluated her in person.  She denied one-sided numbness or weakness or trouble with speech or swallowing.  There was no pain in the left face or pain to the eye her prednisone was started by OB/GYN already she received Augmentin and Valtrex in the emergency room in addition.  She was given a Deltasone prednisone Dosepak by Dr. Roma Kayser.  Dr. Adela Lank stated that the left-sided facial droop included the forehead and paralyzed the left eyebrow.  He noted difficulties with clothing the left eye that no longer present today.  There was no rash and her smile was slightly asymmetric.   Here today :symmetric eye movements, same frequency of blinking, and symmertric, synchronous  eye movements.  This is day 6 th of bels palsy and it is almost completely resolved, I see a slight left  facial smile abnormality and her left eye move upwards when she demonstrated lid closure, still hyperacusis present. The inside of the left buccal tissue is "as if asleep" and food triggers no taste response on the left side of the tongue.       .  This patient reports: Accompanied by interpreter. Referral for Bell's palsy. She feels numbness on the left side of the face, it has improved some. She reports ear pain, especially with loud sounds. She has less sense of taste with her tongue on the left side. Ability to chew has improved.      Review of Systems: Out of a complete 14 system review, the patient complains of only the following symptoms, and all other reviewed systems are negative.  Here today :symmetric eye movements, same frequency of blinking, and symmertric, synchronous  eye movements.  This is day 6 th of bels palsy and it is almost completely resolved, I see a slight left facial smile abnormality and her left eye move upwards when she demonstrated lid closure, still hyperacusis present. The inside of the left buccal tissue is "as if asleep" and food triggers no taste response on the left side of the tongue.    Social History   Socioeconomic History   Marital status: Married    Spouse name: Not on file   Number of children: Not on file   Years of education: Not on file   Highest education level: Not on file  Occupational History   Not on file  Tobacco Use   Smoking status: Never   Smokeless tobacco: Never  Substance and Sexual Activity   Alcohol use: No   Drug use: No   Sexual activity: Yes    Birth control/protection: Condom  Other Topics Concern   Not on file  Social History Narrative   Not on file   Social Determinants of Health   Financial Resource Strain: Low Risk  (03/28/2023)   Received from [facility], [facility]   Overall Financial Resource Strain (CARDIA)    Difficulty of Paying Living Expenses: Not hard at all  Food Insecurity: No Food  Insecurity (04/26/2023)   Hunger Vital Sign    Worried About Running Out of Food in the Last Year: Never true    Ran Out of Food in the Last Year: Never true  Transportation Needs: No Transportation Needs (04/26/2023)   PRAPARE - Administrator, Civil Service (Medical): No    Lack of Transportation (Non-Medical): No  Physical Activity: Not on file  Stress: No Stress Concern Present (03/28/2023)   Received from [facility], [facility] of Occupational Health - Occupational Stress Questionnaire    Feeling of Stress : Not at all  Social Connections: Not on file  Intimate Partner Violence: Not on file    Family History  Problem Relation Age of Onset   Diabetes Maternal Grandmother    Cancer Maternal Grandfather     Past Medical History:  Diagnosis Date   Breast discharge    HIV (human immunodeficiency virus infection) (HCC)     Past Surgical History:  Procedure Laterality Date   DIAGNOSTIC LAPAROSCOPY WITH REMOVAL OF ECTOPIC PREGNANCY Left 04/27/2023   Procedure: DIAGNOSTIC LAPAROSCOPY LEFT SALPINGECTOMY WITH REMOVAL OF ECTOPIC PREGNANCY;  Surgeon: Lennart Pall, MD;  Location: [facility] OR;  Service: Gynecology;  Laterality: Left;   IRRIGATION AND DEBRIDEMENT ABSCESS Right 09/30/2015   Procedure: IRRIGATION AND DEBRIDEMENT FLANK ABSCESS;  Surgeon: De Blanch Kinsinger, MD;  Location: [facility] OR;  Service: General;  Laterality: Right;    Current Outpatient Medications  Medication Sig Dispense Refill   amoxicillin-clavulanate (AUGMENTIN) 875-125 MG tablet Take 1 tablet by mouth every 12 (twelve) hours. 14 tablet 0   DOVATO 50-300 MG tablet Take 1 tablet by mouth daily.     metFORMIN (GLUCOPHAGE) 500 MG tablet Take 500 mg by mouth 2 (two) times daily with a meal.     predniSONE (DELTASONE) 20 MG tablet Take 3 tablets (60 mg total) by mouth daily with breakfast for 7 days. 21 tablet 0   valACYclovir (VALTREX) 1000 MG tablet Take 1 tablet (1,000 mg total)  by mouth 3 (three) times daily. 21 tablet 0   No current facility-administered medications for this visit.    Allergies as of 06/04/2023   (No Known Allergies)    Vitals: BP 122/84   Pulse 72   Ht 5\' 2"  (1.575 m)   Wt 263 lb 12.8 oz (119.7 kg)   LMP 03/01/2023 (Exact Date)   BMI 48.25 kg/m  Last Weight:  Wt Readings from Last 1 Encounters:  06/04/23 263 lb 12.8 oz (119.7 kg)   Last Height:   Ht Readings from Last 1 Encounters:  06/04/23 5\' 2"  (1.575 m)   Last BMI: @LASTBMI  Physical exam:  General: The patient is awake, alert and appears not in acute distress.  The patient is well groomed. Head: Normocephalic, atraumatic.  Neck is supple.  Neurologic exam : The patient is awake and alert, oriented to place and time.  Speech is fluent without  dysarthria, dysphonia or aphasia.  Mood and affect are appropriate.  Cranial nerves: Pupils are equal and briskly reactive to light. Funduscopic exam without  evidence of pallor or edema. Extraocular movements  in vertical and horizontal planes intact and without nystagmus. Visual fields by finger perimetry are intact. Hearing to finger rub intact.  Facial sensation intact to fine touch. Facial motor strength is symmetric and tongue and uvula move midline.  Motor exam:   Normal tone and normal muscle bulk and symmetric normal strength in all extremities. Grip Strength intact  Proximal strength of shoulder muscles and hip flexors was intact .  Sensory:  Fine touch and vibration were tested . Proprioception was tested in the upper extremities only and was  normal.  Coordination: Rapid alternating movements in the fingers/hands were normal.  Finger-to-nose maneuver was tested and showed no evidence of ataxia, dysmetria or tremor.  Gait and station: Patient walked without assistive device  Core Strength within normal limits. Stance is stable and of normal base.   Deep tendon reflexes: in the  upper and lower extremities are symmetric  Babinski maneuver response is  downgoing.   Assessment: Total time for face to face interview and examination, for review of  images and laboratory testing, neurophysiology testing and pre-existing records, including out-of -network , was 30 minutes. Assessment is as follows here:  No fever, no nausea, no confusion, no neck stiffness,  no blisters,no rash.     BELLS PALSY day 6-7 .   Here today :symmetric eye movements, same frequency of blinking, and symmertric, synchronous  eye movements.  This is day 6 th of bels palsy and it is almost completely resolved, I see a slight left facial smile abnormality and her left eye move upwards when she demonstrated lid closure, still hyperacusis present. The inside of the left buccal tissue is "as if asleep" and food triggers no taste response on the left side of the tongue.      Plan:  Treatment plan and additional workup planned after today includes:   Finished medication from ED by tomorrow.  I don't expect any residual form this event. No RV needed unless she has a bothersome residual.   1)  hyperacusis will likely resolve in 2 months, same for tongue numbness.  2)  RV with PCP  should a residual be present.    Melvyn Novas, MD    [facility] [place] Citizen Roberts,MD

## 2023-06-04 NOTE — Patient Instructions (Signed)
Parlisis facial en los adultos Bell's Palsy, Adult  La parlisis facial es una incapacidad a corto plazo para mover los msculos de una parte del rostro. La incapacidad para moverse, tambin llamada parlisis, es el resultado de la inflamacin o la compresin del sptimo nervio craneal. Este nervio se desplaza a lo largo del crneo y por debajo de la oreja hasta el lado de la cara. Este nervio es responsable de los movimientos faciales, que incluyen parpadear, cerrar los ojos, sonrer y Development worker, community. Cules son las causas? Se desconoce la causa exacta de esta afeccin. Puede ser causada por la infeccin de un virus, como el virus de la varicela (herpes zster), de Epstein-Barr o de las paperas. Qu incrementa el riesgo? Es ms probable que sufra esta afeccin si: Est embarazada. Tiene diabetes. Recientemente ha tenido una infeccin en la nariz, la garganta o las vas respiratorias. Tiene debilitado el sistema de defensa del organismo (sistema inmunitario). Tuvo una lesin facial, como una fractura. Tiene antecedentes familiares de parlisis facial. Cules son los signos o los sntomas? Los sntomas de esta afeccin incluyen: Debilidad en un lado del rostro. Cada del prpado y de la comisura de la boca. Exceso de lagrimeo en uno de los ojos. Dificultad para cerrar el prpado. Venita [place]. Sequedad en la boca. Cambios en el sentido del gusto. Cambio en el aspecto facial. Dolor detrs de [place]. Zumbido en una o ambas orejas. Sensibilidad al ruido en [place]. Contraccin facial. Dolor de cabeza. Trastornos del habla. Mareos. Dificultad para comer o beber. La mayor parte del [place], solo una parte del rostro se ve afectada. En casos muy poco frecuentes, la parlisis facial puede afectar todo el rostro. Cmo se diagnostica? Esta afeccin se diagnostica en funcin de lo siguiente: Sus sntomas. Sus antecedentes mdicos. Un examen fsico. Es posible que, adems,  deba consultar a un especialista en trastornos de los nervios (neurlogo) o enfermedades y afecciones oculares (oftalmlogo). Pueden hacerle estudios, por ejemplo: Una prueba para controlar si hubo dao nervioso (electromiograma). Estudios de diagnstico por imgenes, como una exploracin por tomografa computarizada (TC) o Health visitor (RM). Anlisis de [place]. Cmo se trata? Esta afeccin afecta a cada persona de un modo diferente. A veces, los sntomas desaparecen sin tratamiento en el plazo de unas semanas. Si el tratamiento es necesario, vara de persona a Social worker. El objetivo del tratamiento es reducir la inflamacin y proteger los ojos para que no se daen. El tratamiento de la parlisis facial puede incluir lo siguiente: Medicamentos, como por ejemplo: Corticoesteroides para reducir la hinchazn y la inflamacin. Medicamentos antivirales. Analgsicos, como aspirina, paracetamol o ibuprofeno. Gotas oftlmicas o ungento para [employer]. Proteccin para los ojos, si no puede cerrar el ojo. Ejercicios o masajes para recuperar la fuerza y la funcin del msculo (fisioterapia). Siga estas instrucciones en su casa:  Use los medicamentos de venta libre y los recetados solamente como se lo haya indicado el mdico. Si el ojo est afectado: Aplique el ungento o las gotas oftlmicas para Pharmacologist los ojos humectados tal como se lo haya indicado el mdico. Siga las instrucciones del mdico para el cuidado y Training and development officer de los ojos como se lo haya indicado el mdico. Haga los ejercicios de fisioterapia como se lo haya indicado el mdico. Cumpla con todas las visitas de seguimiento. Esto es importante. Comunquese con un mdico si: Tiene fiebre o escalofros. Los sntomas no mejoran en un lapso de 2 a 3 semanas, o empeoran. El ojo est  rojo, irritado o [employer]. Aparecen nuevos sntomas. Solicite ayuda de inmediato si: Siente debilidad o entumecimiento en alguna  parte del cuerpo que no sea el rostro. Tiene dificultad para tragar. Tiene dolor o rigidez en el cuello. Tiene mareos o Company secretary. Resumen La parlisis facial es una incapacidad a corto plazo para mover los msculos de una parte del rostro. La incapacidad para moverse es el resultado de la inflamacin o la compresin del nervio facial. Esta afeccin afecta a cada persona de un modo diferente. A veces, los sntomas desaparecen sin tratamiento en el plazo de unas semanas. Si el tratamiento es necesario, vara de persona a Social worker. El objetivo del tratamiento es reducir la inflamacin y proteger los ojos para que no se daen. Comunquese con el mdico si los sntomas no mejoran en el lapso de 2 a 3 semanas, o empeoran. Esta informacin no tiene Theme park manager el consejo del mdico. Asegrese de hacerle al mdico cualquier pregunta que tenga. Document Revised: 08/29/2020 Document Reviewed: 08/29/2020 Elsevier Patient Education  2024 [employer] Inc. Bell's Palsy, Adult  Bell's palsy is a short-term inability to move muscles in a part of the face. The inability to move, also called paralysis, results from inflammation or compression of the seventh cranial nerve. This nerve travels along the skull and under the ear to the side of the face. This nerve is responsible for facial movements that include blinking, closing the eyes, smiling, and frowning. What are the causes? The exact cause of this condition is not known. It may be caused by an infection from a virus, such as the chickenpox (herpes zoster), Epstein-Barr, or mumps virus. What increases the risk? You are more likely to develop this condition if: You are pregnant. You have diabetes. You have had a recent infection in your nose, throat, or airways. You have a weakened body defense system (immune system). You have had a facial injury, such as a fracture. You have a family history of Bell's palsy. What are the signs or  symptoms? Symptoms of this condition include: Weakness on one side of the face. Drooping eyelid and corner of the mouth. Excessive tearing in one eye. Difficulty closing the eyelid. Dry eye. Drooling. Dry mouth. Changes in taste. Change in facial appearance. Pain behind one ear. Ringing in one or both ears. Sensitivity to sound in one ear. Facial twitching. Headache. Impaired speech. Dizziness. Difficulty eating or drinking. Most of the time, only one side of the face is affected. In rare cases, Bell's palsy may affect the whole face. How is this diagnosed? This condition is diagnosed based on: Your symptoms. Your medical history. A physical exam. You may also have to see health care providers who specialize in disorders of the nerves (neurologist) or diseases and conditions of the eye (ophthalmologist). You may have tests, such as: A test to check for nerve damage (electromyogram). Imaging studies, such as a CT scan or an MRI. Blood tests. How is this treated? This condition affects every person differently. Sometimes symptoms go away without treatment within a couple weeks. If treatment is needed, it varies from person to person. The goal of treatment is to reduce inflammation and protect the eye from damage. Treatment for Bell's palsy may include: Medicines, such as: Steroids to reduce swelling and inflammation. Antiviral medicines. Pain relievers, including aspirin, acetaminophen, or ibuprofen. Eye drops or ointment to keep your eye moist. Eye protection, if you cannot close your eye. Exercises or massage to regain muscle strength and function (  physical therapy). Follow these instructions at home:  Take over-the-counter and prescription medicines only as told by your health care provider. If your eye is affected: Keep your eye moist with eye drops or ointment as told by your health care provider. Follow instructions for eye care and protection as told by your health  care provider. Do any physical therapy exercises as told by your health care provider. Keep all follow-up visits. This is important. Contact a health care provider if: You have a fever or chills. Your symptoms do not get better within 2-3 weeks, or your symptoms get worse. Your eye is red, irritated, or painful. You have new symptoms. Get help right away if: You have weakness or numbness in a part of your body other than your face. You have trouble swallowing. You develop neck pain or stiffness. You develop dizziness or shortness of breath. Summary Bell's palsy is a short-term inability to move muscles in a part of the face. The inability to move results from inflammation or compression of the facial nerve. This condition affects every person differently. Sometimes symptoms go away without treatment within a couple weeks. If treatment is needed, it varies from person to person. The goal of treatment is to reduce inflammation and protect the eye from damage. Contact your health care provider if your symptoms do not get better within 2-3 weeks, or your symptoms get worse. This information is not intended to replace advice given to you by your health care provider. Make sure you discuss any questions you have with your health care provider. Document Revised: 08/04/2020 Document Reviewed: 08/04/2020 Elsevier Patient Education  2024 [employer].

## 2023-06-06 ENCOUNTER — Encounter: Payer: Self-pay | Admitting: Obstetrics and Gynecology

## 2023-06-06 ENCOUNTER — Ambulatory Visit (INDEPENDENT_AMBULATORY_CARE_PROVIDER_SITE_OTHER): Payer: Self-pay | Admitting: Obstetrics and Gynecology

## 2023-06-06 ENCOUNTER — Other Ambulatory Visit: Payer: Self-pay

## 2023-06-06 VITALS — BP 125/86 | HR 78 | Wt 260.2 lb

## 2023-06-06 DIAGNOSIS — Z3169 Encounter for other general counseling and advice on procreation: Secondary | ICD-10-CM

## 2023-06-06 NOTE — Progress Notes (Signed)
Patient here for ectopic pregnancy. Patient stated that she has stopped bleeding and denies any pain.  Mental health services was offered to patient and she declined stating that she has good support from family.   Questions or concerns: None

## 2023-06-06 NOTE — Patient Instructions (Signed)
[phone] to schedule hysterosalpingogram

## 2023-06-06 NOTE — Progress Notes (Signed)
GYNECOLOGY VISIT  Patient name: Jamie Heath MRN [id number]  Date of birth: Oct 07, 1991 Chief Complaint:   No chief complaint on file.   History:  Jamie Heath is a 32 y.o. G1P0010 being seen today for infertility.  Have not really been actively trying to get pregnnat and then started to take vitamins and then got ectopic Has been taking multivitamin  This ectopic pregnancy was first positive pregnacy test No prior diagnosis of PID, no tobocco use, no MJ by either partner  68 years old partner, non smoker, no health problems, no children, no semen analysis    Past Medical History:  Diagnosis Date   Breast discharge    HIV (human immunodeficiency virus infection) (HCC)     Past Surgical History:  Procedure Laterality Date   DIAGNOSTIC LAPAROSCOPY WITH REMOVAL OF ECTOPIC PREGNANCY Left 04/27/2023   Procedure: DIAGNOSTIC LAPAROSCOPY LEFT SALPINGECTOMY WITH REMOVAL OF ECTOPIC PREGNANCY;  Surgeon: Lennart Pall, MD;  Location: [facility] OR;  Service: Gynecology;  Laterality: Left;   IRRIGATION AND DEBRIDEMENT ABSCESS Right 09/30/2015   Procedure: IRRIGATION AND DEBRIDEMENT FLANK ABSCESS;  Surgeon: Rodman Pickle, MD;  Location: [facility] OR;  Service: General;  Laterality: Right;    The following portions of the patient's history were reviewed and updated as appropriate: allergies, current medications, past family history, past medical history, past social history, past surgical history and problem list.   Health Maintenance:   Last pap 07/2022 NILM HPV negative  Last mammogram: n/a   Review of Systems:  Pertinent items are noted in HPI. Comprehensive review of systems was otherwise negative.   Objective:  Physical Exam BP 125/86   Pulse 78   Wt 260 lb 3.2 oz (118 kg)   LMP 05/30/2023 (Approximate)   Breastfeeding No   BMI 47.59 kg/m    Physical Exam Vitals and nursing note reviewed.  Constitutional:      Appearance: Normal appearance.   HENT:     Head: Normocephalic and atraumatic.  Pulmonary:     Effort: Pulmonary effort is normal.  Skin:    General: Skin is warm and dry.  Neurological:     General: No focal deficit present.     Mental Status: She is alert.  Psychiatric:        Mood and Affect: Mood normal.        Behavior: Behavior normal.        Thought Content: Thought content normal.        Judgment: Judgment normal.      Labs and Imaging CT Head Wo Contrast  Result Date: 05/29/2023 CLINICAL DATA:  Neurological deficit, suspected stroke EXAM: CT HEAD WITHOUT CONTRAST TECHNIQUE: Contiguous axial images were obtained from the base of the skull through the vertex without intravenous contrast. RADIATION DOSE REDUCTION: This exam was performed according to the departmental dose-optimization program which includes automated exposure control, adjustment of the mA and/or kV according to patient size and/or use of iterative reconstruction technique. COMPARISON:  None Available. FINDINGS: Brain: No acute intracranial findings are seen. There are no signs of bleeding within the cranium. Ventricles are not dilated. There is no focal edema or mass effect. Vascular: Unremarkable. Skull: No acute findings are seen. Sinuses/Orbits: There is air-fluid level in left maxillary sinus. Other: None. IMPRESSION: No acute intracranial findings are seen in noncontrast CT brain. Air-fluid level in left maxillary sinus may suggest left maxillary sinusitis. Electronically Signed   By: Ernie Avena M.D.   On: 05/29/2023 19:19  Assessment & Plan:   1. Infertility counseling HSG to assess for patency of remaining tube (mid to late Aug preferred given surgery). Currently uninsured, discussed costs that can be associated with infertility care, can place referral to [facility]. If tubes patent, will trial ovulation induction  - DG Hysterogram (HSG); Future  Routine preventative health maintenance measures emphasized.  [name],  MD [facility], [facility]

## 2023-06-18 ENCOUNTER — Ambulatory Visit: Admit: 2023-06-18 | Discharge: 2023-06-19

## 2023-06-18 DIAGNOSIS — E119 Type 2 diabetes mellitus without complications: Principal | ICD-10-CM

## 2023-06-18 DIAGNOSIS — B20 Human immunodeficiency virus [HIV] disease: Principal | ICD-10-CM

## 2023-06-18 MED ORDER — DOVATO 50 MG-300 MG TABLET
ORAL_TABLET | Freq: Every day | ORAL | 5 refills | 30 days | Status: CP
Start: 2023-06-18 — End: ?

## 2023-07-02 ENCOUNTER — Ambulatory Visit: Admit: 2023-07-02 | Discharge: 2023-07-03

## 2023-07-02 DIAGNOSIS — Z0000 Encounter for general adult medical examination without abnormal findings: Principal | ICD-10-CM

## 2023-07-02 DIAGNOSIS — E119 Type 2 diabetes mellitus without complications: Principal | ICD-10-CM

## 2023-07-02 DIAGNOSIS — N979 Female infertility, unspecified: Principal | ICD-10-CM

## 2023-07-02 DIAGNOSIS — R7989 Other specified abnormal findings of blood chemistry: Principal | ICD-10-CM

## 2023-07-02 DIAGNOSIS — N912 Amenorrhea, unspecified: Principal | ICD-10-CM

## 2023-07-02 DIAGNOSIS — B20 Human immunodeficiency virus [HIV] disease: Principal | ICD-10-CM

## 2023-07-02 MED ORDER — METFORMIN 500 MG TABLET
ORAL_TABLET | Freq: Every day | ORAL | 3 refills | 90 days | Status: CP
Start: 2023-07-02 — End: ?

## 2023-07-22 ENCOUNTER — Encounter: Payer: Self-pay | Admitting: Obstetrics and Gynecology

## 2023-07-24 ENCOUNTER — Ambulatory Visit: Admission: RE | Admit: 2023-07-24 | Payer: Self-pay | Source: Ambulatory Visit

## 2023-07-24 DIAGNOSIS — B20 Human immunodeficiency virus [HIV] disease: Principal | ICD-10-CM

## 2023-08-05 ENCOUNTER — Ambulatory Visit: Payer: Self-pay | Admitting: Podiatry

## 2023-10-14 DIAGNOSIS — B20 Human immunodeficiency virus [HIV] disease: Principal | ICD-10-CM

## 2023-10-14 MED ORDER — DOVATO 50 MG-300 MG TABLET
ORAL_TABLET | Freq: Every day | ORAL | 5 refills | 30 days
Start: 2023-10-14 — End: ?

## 2023-10-15 MED ORDER — DOVATO 50 MG-300 MG TABLET
ORAL_TABLET | Freq: Every day | ORAL | 5 refills | 30 days | Status: CP
Start: 2023-10-15 — End: ?

## 2023-10-21 ENCOUNTER — Ambulatory Visit: Admit: 2023-10-21 | Discharge: 2023-10-22 | Attending: Obstetrics & Gynecology | Primary: Obstetrics & Gynecology

## 2023-10-21 DIAGNOSIS — Z3169 Encounter for other general counseling and advice on procreation: Principal | ICD-10-CM

## 2023-10-21 MED ORDER — CLOMIPHENE CITRATE 50 MG TABLET
ORAL_TABLET | Freq: Every day | ORAL | 1 refills | 5 days | Status: CP
Start: 2023-10-21 — End: 2023-10-26

## 2024-02-04 ENCOUNTER — Ambulatory Visit: Admit: 2024-02-04 | Discharge: 2024-02-05

## 2024-02-04 DIAGNOSIS — E119 Type 2 diabetes mellitus without complications: Principal | ICD-10-CM

## 2024-02-04 DIAGNOSIS — B20 Human immunodeficiency virus [HIV] disease: Principal | ICD-10-CM

## 2024-02-04 DIAGNOSIS — Z23 Encounter for immunization: Principal | ICD-10-CM

## 2024-02-04 DIAGNOSIS — N979 Female infertility, unspecified: Principal | ICD-10-CM

## 2024-03-25 DIAGNOSIS — B20 Human immunodeficiency virus [HIV] disease: Principal | ICD-10-CM

## 2024-03-25 MED ORDER — DOVATO 50 MG-300 MG TABLET
ORAL_TABLET | Freq: Every day | ORAL | 5 refills | 30.00000 days | Status: CP
Start: 2024-03-25 — End: ?

## 2024-03-25 MED ORDER — METFORMIN 500 MG TABLET
ORAL_TABLET | Freq: Every day | ORAL | 3 refills | 90.00000 days | Status: CP
Start: 2024-03-25 — End: ?

## 2024-10-01 ENCOUNTER — Encounter: Admit: 2024-10-01 | Discharge: 2024-10-01

## 2024-10-01 DIAGNOSIS — E119 Type 2 diabetes mellitus without complications: Principal | ICD-10-CM

## 2024-10-01 DIAGNOSIS — B20 Human immunodeficiency virus [HIV] disease: Principal | ICD-10-CM

## 2024-10-01 DIAGNOSIS — Z9189 Other specified personal risk factors, not elsewhere classified: Principal | ICD-10-CM

## 2024-10-01 DIAGNOSIS — Z23 Encounter for immunization: Principal | ICD-10-CM

## 2024-10-01 DIAGNOSIS — Z79899 Other long term (current) drug therapy: Principal | ICD-10-CM

## 2024-10-01 DIAGNOSIS — Z3169 Encounter for other general counseling and advice on procreation: Principal | ICD-10-CM

## 2024-10-01 DIAGNOSIS — Z5181 Encounter for therapeutic drug level monitoring: Principal | ICD-10-CM

## 2024-10-01 DIAGNOSIS — Z131 Encounter for screening for diabetes mellitus: Principal | ICD-10-CM

## 2024-10-01 MED ORDER — DOVATO 50 MG-300 MG TABLET
ORAL_TABLET | Freq: Every day | ORAL | 5 refills | 30.00000 days | Status: CP
Start: 2024-10-01 — End: ?

## 2024-11-24 DIAGNOSIS — B20 Human immunodeficiency virus [HIV] disease: Principal | ICD-10-CM

## 2024-11-24 MED ORDER — DOVATO 50 MG-300 MG TABLET
ORAL_TABLET | Freq: Every day | ORAL | 5 refills | 0.00000 days
Start: 2024-11-24 — End: ?

## 2024-11-25 MED ORDER — DOVATO 50 MG-300 MG TABLET
ORAL_TABLET | Freq: Every day | ORAL | 3 refills | 30.00000 days | Status: CP
Start: 2024-11-25 — End: ?

## 2024-11-26 DIAGNOSIS — B20 Human immunodeficiency virus [HIV] disease: Principal | ICD-10-CM

## 2024-11-26 MED ORDER — DOVATO 50 MG-300 MG TABLET
ORAL_TABLET | Freq: Every day | ORAL | 3 refills | 30.00000 days
Start: 2024-11-26 — End: ?

## 2024-11-30 DIAGNOSIS — B20 Human immunodeficiency virus [HIV] disease: Principal | ICD-10-CM

## 2024-11-30 MED ORDER — DOVATO 50 MG-300 MG TABLET
ORAL_TABLET | Freq: Every day | ORAL | 3 refills | 30.00000 days
Start: 2024-11-30 — End: ?

## 2024-12-01 DIAGNOSIS — B20 Human immunodeficiency virus [HIV] disease: Principal | ICD-10-CM

## 2024-12-01 MED ORDER — DOVATO 50 MG-300 MG TABLET
ORAL_TABLET | Freq: Every day | ORAL | 11 refills | 30.00000 days | Status: CP
Start: 2024-12-01 — End: ?
